# Patient Record
Sex: Female | Born: 1955
Health system: Southern US, Community
[De-identification: ages and names within clinical notes are randomized; demographics above are authoritative.]

## PROBLEM LIST (undated history)

## (undated) DIAGNOSIS — E039 Hypothyroidism, unspecified: Secondary | ICD-10-CM

## (undated) DIAGNOSIS — K219 Gastro-esophageal reflux disease without esophagitis: Secondary | ICD-10-CM

## (undated) DIAGNOSIS — C8590 Non-Hodgkin lymphoma, unspecified, unspecified site: Secondary | ICD-10-CM

## (undated) HISTORY — PX: PORTACATH PLACEMENT: SHX2246

## (undated) HISTORY — DX: Hypothyroidism, unspecified: E03.9

## (undated) HISTORY — PX: CATARACT EXTRACTION: SUR2

## (undated) HISTORY — PX: TONSILLECTOMY: SUR1361

## (undated) HISTORY — PX: CHOLECYSTECTOMY: SHX55

---

## 2004-07-25 ENCOUNTER — Ambulatory Visit: Payer: Self-pay | Admitting: Family Medicine

## 2005-06-02 ENCOUNTER — Ambulatory Visit: Payer: Self-pay | Admitting: Family Medicine

## 2005-07-20 ENCOUNTER — Ambulatory Visit: Payer: Self-pay | Admitting: Family Medicine

## 2005-09-28 ENCOUNTER — Ambulatory Visit: Payer: Self-pay | Admitting: Family Medicine

## 2005-11-20 ENCOUNTER — Ambulatory Visit: Payer: Self-pay | Admitting: Family Medicine

## 2006-03-26 ENCOUNTER — Ambulatory Visit: Payer: Self-pay | Admitting: Family Medicine

## 2016-02-23 DIAGNOSIS — H2513 Age-related nuclear cataract, bilateral: Secondary | ICD-10-CM | POA: Diagnosis not present

## 2016-10-03 DIAGNOSIS — H2513 Age-related nuclear cataract, bilateral: Secondary | ICD-10-CM | POA: Diagnosis not present

## 2016-11-24 DIAGNOSIS — H25013 Cortical age-related cataract, bilateral: Secondary | ICD-10-CM | POA: Diagnosis not present

## 2016-11-24 DIAGNOSIS — H5213 Myopia, bilateral: Secondary | ICD-10-CM | POA: Diagnosis not present

## 2016-11-24 DIAGNOSIS — H524 Presbyopia: Secondary | ICD-10-CM | POA: Diagnosis not present

## 2016-11-24 DIAGNOSIS — H2513 Age-related nuclear cataract, bilateral: Secondary | ICD-10-CM | POA: Diagnosis not present

## 2016-12-12 DIAGNOSIS — H25011 Cortical age-related cataract, right eye: Secondary | ICD-10-CM | POA: Diagnosis not present

## 2016-12-12 DIAGNOSIS — H2511 Age-related nuclear cataract, right eye: Secondary | ICD-10-CM | POA: Diagnosis not present

## 2016-12-12 DIAGNOSIS — H25811 Combined forms of age-related cataract, right eye: Secondary | ICD-10-CM | POA: Diagnosis not present

## 2017-01-16 DIAGNOSIS — H25812 Combined forms of age-related cataract, left eye: Secondary | ICD-10-CM | POA: Diagnosis not present

## 2017-01-16 DIAGNOSIS — H2512 Age-related nuclear cataract, left eye: Secondary | ICD-10-CM | POA: Diagnosis not present

## 2017-01-16 DIAGNOSIS — H25012 Cortical age-related cataract, left eye: Secondary | ICD-10-CM | POA: Diagnosis not present

## 2017-05-18 DIAGNOSIS — G35 Multiple sclerosis: Secondary | ICD-10-CM | POA: Diagnosis not present

## 2017-06-13 DIAGNOSIS — Z961 Presence of intraocular lens: Secondary | ICD-10-CM | POA: Diagnosis not present

## 2017-08-17 DIAGNOSIS — M9901 Segmental and somatic dysfunction of cervical region: Secondary | ICD-10-CM | POA: Diagnosis not present

## 2017-08-17 DIAGNOSIS — M5413 Radiculopathy, cervicothoracic region: Secondary | ICD-10-CM | POA: Diagnosis not present

## 2017-08-17 DIAGNOSIS — M546 Pain in thoracic spine: Secondary | ICD-10-CM | POA: Diagnosis not present

## 2017-08-17 DIAGNOSIS — M9902 Segmental and somatic dysfunction of thoracic region: Secondary | ICD-10-CM | POA: Diagnosis not present

## 2017-08-20 DIAGNOSIS — M9902 Segmental and somatic dysfunction of thoracic region: Secondary | ICD-10-CM | POA: Diagnosis not present

## 2017-08-20 DIAGNOSIS — M546 Pain in thoracic spine: Secondary | ICD-10-CM | POA: Diagnosis not present

## 2017-08-20 DIAGNOSIS — M5413 Radiculopathy, cervicothoracic region: Secondary | ICD-10-CM | POA: Diagnosis not present

## 2017-08-20 DIAGNOSIS — M9901 Segmental and somatic dysfunction of cervical region: Secondary | ICD-10-CM | POA: Diagnosis not present

## 2017-08-24 DIAGNOSIS — M9901 Segmental and somatic dysfunction of cervical region: Secondary | ICD-10-CM | POA: Diagnosis not present

## 2017-08-24 DIAGNOSIS — M546 Pain in thoracic spine: Secondary | ICD-10-CM | POA: Diagnosis not present

## 2017-08-24 DIAGNOSIS — M5413 Radiculopathy, cervicothoracic region: Secondary | ICD-10-CM | POA: Diagnosis not present

## 2017-08-24 DIAGNOSIS — M9902 Segmental and somatic dysfunction of thoracic region: Secondary | ICD-10-CM | POA: Diagnosis not present

## 2017-08-28 DIAGNOSIS — M9901 Segmental and somatic dysfunction of cervical region: Secondary | ICD-10-CM | POA: Diagnosis not present

## 2017-08-28 DIAGNOSIS — M9902 Segmental and somatic dysfunction of thoracic region: Secondary | ICD-10-CM | POA: Diagnosis not present

## 2017-08-28 DIAGNOSIS — M5413 Radiculopathy, cervicothoracic region: Secondary | ICD-10-CM | POA: Diagnosis not present

## 2017-08-28 DIAGNOSIS — M546 Pain in thoracic spine: Secondary | ICD-10-CM | POA: Diagnosis not present

## 2017-08-30 DIAGNOSIS — M546 Pain in thoracic spine: Secondary | ICD-10-CM | POA: Diagnosis not present

## 2017-08-30 DIAGNOSIS — M9901 Segmental and somatic dysfunction of cervical region: Secondary | ICD-10-CM | POA: Diagnosis not present

## 2017-08-30 DIAGNOSIS — M5413 Radiculopathy, cervicothoracic region: Secondary | ICD-10-CM | POA: Diagnosis not present

## 2017-08-30 DIAGNOSIS — M9902 Segmental and somatic dysfunction of thoracic region: Secondary | ICD-10-CM | POA: Diagnosis not present

## 2017-09-03 DIAGNOSIS — M9902 Segmental and somatic dysfunction of thoracic region: Secondary | ICD-10-CM | POA: Diagnosis not present

## 2017-09-03 DIAGNOSIS — M9901 Segmental and somatic dysfunction of cervical region: Secondary | ICD-10-CM | POA: Diagnosis not present

## 2017-09-03 DIAGNOSIS — M546 Pain in thoracic spine: Secondary | ICD-10-CM | POA: Diagnosis not present

## 2017-09-03 DIAGNOSIS — M5413 Radiculopathy, cervicothoracic region: Secondary | ICD-10-CM | POA: Diagnosis not present

## 2017-09-05 ENCOUNTER — Other Ambulatory Visit (HOSPITAL_COMMUNITY): Payer: Self-pay | Admitting: Chiropractic Medicine

## 2017-09-05 DIAGNOSIS — M5413 Radiculopathy, cervicothoracic region: Secondary | ICD-10-CM

## 2017-09-06 DIAGNOSIS — M546 Pain in thoracic spine: Secondary | ICD-10-CM | POA: Diagnosis not present

## 2017-09-06 DIAGNOSIS — M9901 Segmental and somatic dysfunction of cervical region: Secondary | ICD-10-CM | POA: Diagnosis not present

## 2017-09-06 DIAGNOSIS — M9902 Segmental and somatic dysfunction of thoracic region: Secondary | ICD-10-CM | POA: Diagnosis not present

## 2017-09-06 DIAGNOSIS — M5413 Radiculopathy, cervicothoracic region: Secondary | ICD-10-CM | POA: Diagnosis not present

## 2017-09-10 ENCOUNTER — Ambulatory Visit (HOSPITAL_COMMUNITY): Payer: Self-pay

## 2017-09-12 ENCOUNTER — Ambulatory Visit (HOSPITAL_COMMUNITY)
Admission: RE | Admit: 2017-09-12 | Discharge: 2017-09-12 | Disposition: A | Discharge status: Home / Self Care | Payer: Self-pay | Source: Ambulatory Visit | Attending: Chiropractic Medicine | Admitting: Chiropractic Medicine

## 2017-09-12 DIAGNOSIS — M488X2 Other specified spondylopathies, cervical region: Secondary | ICD-10-CM | POA: Insufficient documentation

## 2017-09-12 DIAGNOSIS — M5413 Radiculopathy, cervicothoracic region: Secondary | ICD-10-CM | POA: Insufficient documentation

## 2017-09-12 DIAGNOSIS — M47812 Spondylosis without myelopathy or radiculopathy, cervical region: Secondary | ICD-10-CM | POA: Insufficient documentation

## 2017-09-14 DIAGNOSIS — R221 Localized swelling, mass and lump, neck: Secondary | ICD-10-CM | POA: Diagnosis not present

## 2017-09-20 DIAGNOSIS — G540 Brachial plexus disorders: Secondary | ICD-10-CM | POA: Diagnosis not present

## 2017-09-20 DIAGNOSIS — E049 Nontoxic goiter, unspecified: Secondary | ICD-10-CM | POA: Diagnosis not present

## 2017-09-20 DIAGNOSIS — M79601 Pain in right arm: Secondary | ICD-10-CM | POA: Diagnosis not present

## 2017-09-20 DIAGNOSIS — E079 Disorder of thyroid, unspecified: Secondary | ICD-10-CM | POA: Diagnosis not present

## 2017-09-20 DIAGNOSIS — Z79891 Long term (current) use of opiate analgesic: Secondary | ICD-10-CM | POA: Diagnosis not present

## 2017-09-20 DIAGNOSIS — R221 Localized swelling, mass and lump, neck: Secondary | ICD-10-CM | POA: Diagnosis not present

## 2017-09-27 DIAGNOSIS — C8331 Diffuse large B-cell lymphoma, lymph nodes of head, face, and neck: Secondary | ICD-10-CM | POA: Diagnosis not present

## 2017-09-27 DIAGNOSIS — Z880 Allergy status to penicillin: Secondary | ICD-10-CM | POA: Diagnosis not present

## 2017-09-27 DIAGNOSIS — E039 Hypothyroidism, unspecified: Secondary | ICD-10-CM | POA: Diagnosis not present

## 2017-09-27 DIAGNOSIS — R221 Localized swelling, mass and lump, neck: Secondary | ICD-10-CM | POA: Diagnosis not present

## 2017-09-27 DIAGNOSIS — Z79890 Hormone replacement therapy (postmenopausal): Secondary | ICD-10-CM | POA: Diagnosis not present

## 2017-09-27 DIAGNOSIS — Z79891 Long term (current) use of opiate analgesic: Secondary | ICD-10-CM | POA: Diagnosis not present

## 2017-09-27 DIAGNOSIS — Z79899 Other long term (current) drug therapy: Secondary | ICD-10-CM | POA: Diagnosis not present

## 2017-09-27 DIAGNOSIS — Z7952 Long term (current) use of systemic steroids: Secondary | ICD-10-CM | POA: Diagnosis not present

## 2017-10-01 DIAGNOSIS — Z880 Allergy status to penicillin: Secondary | ICD-10-CM | POA: Diagnosis not present

## 2017-10-01 DIAGNOSIS — E039 Hypothyroidism, unspecified: Secondary | ICD-10-CM | POA: Diagnosis not present

## 2017-10-01 DIAGNOSIS — Z79891 Long term (current) use of opiate analgesic: Secondary | ICD-10-CM | POA: Diagnosis not present

## 2017-10-01 DIAGNOSIS — Z79890 Hormone replacement therapy (postmenopausal): Secondary | ICD-10-CM | POA: Diagnosis not present

## 2017-10-01 DIAGNOSIS — Z79899 Other long term (current) drug therapy: Secondary | ICD-10-CM | POA: Diagnosis not present

## 2017-10-01 DIAGNOSIS — C8331 Diffuse large B-cell lymphoma, lymph nodes of head, face, and neck: Secondary | ICD-10-CM | POA: Diagnosis not present

## 2017-10-01 DIAGNOSIS — C8339 Diffuse large B-cell lymphoma, extranodal and solid organ sites: Secondary | ICD-10-CM | POA: Diagnosis not present

## 2017-10-01 DIAGNOSIS — R221 Localized swelling, mass and lump, neck: Secondary | ICD-10-CM | POA: Diagnosis not present

## 2017-10-01 DIAGNOSIS — Z7952 Long term (current) use of systemic steroids: Secondary | ICD-10-CM | POA: Diagnosis not present

## 2017-10-02 DIAGNOSIS — G540 Brachial plexus disorders: Secondary | ICD-10-CM | POA: Diagnosis not present

## 2017-10-02 DIAGNOSIS — K6389 Other specified diseases of intestine: Secondary | ICD-10-CM | POA: Diagnosis not present

## 2017-10-02 DIAGNOSIS — K639 Disease of intestine, unspecified: Secondary | ICD-10-CM | POA: Diagnosis not present

## 2017-10-02 DIAGNOSIS — K449 Diaphragmatic hernia without obstruction or gangrene: Secondary | ICD-10-CM | POA: Diagnosis not present

## 2017-10-11 DIAGNOSIS — C8330 Diffuse large B-cell lymphoma, unspecified site: Secondary | ICD-10-CM | POA: Diagnosis not present

## 2017-10-11 DIAGNOSIS — Z452 Encounter for adjustment and management of vascular access device: Secondary | ICD-10-CM | POA: Diagnosis not present

## 2017-10-11 DIAGNOSIS — Z8739 Personal history of other diseases of the musculoskeletal system and connective tissue: Secondary | ICD-10-CM | POA: Diagnosis not present

## 2017-10-11 DIAGNOSIS — K639 Disease of intestine, unspecified: Secondary | ICD-10-CM | POA: Diagnosis not present

## 2017-10-11 DIAGNOSIS — G540 Brachial plexus disorders: Secondary | ICD-10-CM | POA: Diagnosis not present

## 2017-10-12 DIAGNOSIS — D696 Thrombocytopenia, unspecified: Secondary | ICD-10-CM | POA: Diagnosis not present

## 2017-10-12 DIAGNOSIS — Z006 Encounter for examination for normal comparison and control in clinical research program: Secondary | ICD-10-CM | POA: Diagnosis not present

## 2017-10-12 DIAGNOSIS — Z5111 Encounter for antineoplastic chemotherapy: Secondary | ICD-10-CM | POA: Diagnosis not present

## 2017-10-12 DIAGNOSIS — C8331 Diffuse large B-cell lymphoma, lymph nodes of head, face, and neck: Secondary | ICD-10-CM | POA: Diagnosis not present

## 2017-10-12 DIAGNOSIS — Z5112 Encounter for antineoplastic immunotherapy: Secondary | ICD-10-CM | POA: Diagnosis not present

## 2017-10-17 DIAGNOSIS — R933 Abnormal findings on diagnostic imaging of other parts of digestive tract: Secondary | ICD-10-CM | POA: Diagnosis not present

## 2017-10-17 DIAGNOSIS — Z79899 Other long term (current) drug therapy: Secondary | ICD-10-CM | POA: Diagnosis not present

## 2017-10-17 DIAGNOSIS — E039 Hypothyroidism, unspecified: Secondary | ICD-10-CM | POA: Diagnosis not present

## 2017-10-17 DIAGNOSIS — Z880 Allergy status to penicillin: Secondary | ICD-10-CM | POA: Diagnosis not present

## 2017-10-17 DIAGNOSIS — D127 Benign neoplasm of rectosigmoid junction: Secondary | ICD-10-CM | POA: Diagnosis not present

## 2017-10-17 DIAGNOSIS — K635 Polyp of colon: Secondary | ICD-10-CM | POA: Diagnosis not present

## 2017-10-17 DIAGNOSIS — K621 Rectal polyp: Secondary | ICD-10-CM | POA: Diagnosis not present

## 2017-10-17 DIAGNOSIS — Z7952 Long term (current) use of systemic steroids: Secondary | ICD-10-CM | POA: Diagnosis not present

## 2017-10-17 DIAGNOSIS — G35 Multiple sclerosis: Secondary | ICD-10-CM | POA: Diagnosis not present

## 2017-10-17 DIAGNOSIS — Z1211 Encounter for screening for malignant neoplasm of colon: Secondary | ICD-10-CM | POA: Diagnosis not present

## 2017-10-17 DIAGNOSIS — K5730 Diverticulosis of large intestine without perforation or abscess without bleeding: Secondary | ICD-10-CM | POA: Diagnosis not present

## 2017-10-17 DIAGNOSIS — Z79891 Long term (current) use of opiate analgesic: Secondary | ICD-10-CM | POA: Diagnosis not present

## 2017-10-17 DIAGNOSIS — C8330 Diffuse large B-cell lymphoma, unspecified site: Secondary | ICD-10-CM | POA: Diagnosis not present

## 2017-10-17 DIAGNOSIS — Z87891 Personal history of nicotine dependence: Secondary | ICD-10-CM | POA: Diagnosis not present

## 2017-10-18 DIAGNOSIS — E039 Hypothyroidism, unspecified: Secondary | ICD-10-CM | POA: Diagnosis not present

## 2017-10-18 DIAGNOSIS — K219 Gastro-esophageal reflux disease without esophagitis: Secondary | ICD-10-CM | POA: Diagnosis not present

## 2017-10-18 DIAGNOSIS — C8580 Other specified types of non-Hodgkin lymphoma, unspecified site: Secondary | ICD-10-CM | POA: Diagnosis not present

## 2017-10-19 DIAGNOSIS — Z79899 Other long term (current) drug therapy: Secondary | ICD-10-CM | POA: Diagnosis not present

## 2017-10-19 DIAGNOSIS — G540 Brachial plexus disorders: Secondary | ICD-10-CM | POA: Diagnosis not present

## 2017-10-19 DIAGNOSIS — Z452 Encounter for adjustment and management of vascular access device: Secondary | ICD-10-CM | POA: Diagnosis not present

## 2017-10-19 DIAGNOSIS — C8330 Diffuse large B-cell lymphoma, unspecified site: Secondary | ICD-10-CM | POA: Diagnosis not present

## 2017-11-01 DIAGNOSIS — C8331 Diffuse large B-cell lymphoma, lymph nodes of head, face, and neck: Secondary | ICD-10-CM | POA: Diagnosis not present

## 2017-11-01 DIAGNOSIS — G35 Multiple sclerosis: Secondary | ICD-10-CM | POA: Diagnosis not present

## 2017-11-01 DIAGNOSIS — Z79899 Other long term (current) drug therapy: Secondary | ICD-10-CM | POA: Diagnosis not present

## 2017-11-01 DIAGNOSIS — K6389 Other specified diseases of intestine: Secondary | ICD-10-CM | POA: Diagnosis not present

## 2017-11-01 DIAGNOSIS — Z95828 Presence of other vascular implants and grafts: Secondary | ICD-10-CM | POA: Diagnosis not present

## 2017-11-22 DIAGNOSIS — Z79899 Other long term (current) drug therapy: Secondary | ICD-10-CM | POA: Diagnosis not present

## 2017-11-22 DIAGNOSIS — C8331 Diffuse large B-cell lymphoma, lymph nodes of head, face, and neck: Secondary | ICD-10-CM | POA: Diagnosis not present

## 2017-11-22 DIAGNOSIS — Z880 Allergy status to penicillin: Secondary | ICD-10-CM | POA: Diagnosis not present

## 2017-11-22 DIAGNOSIS — K639 Disease of intestine, unspecified: Secondary | ICD-10-CM | POA: Diagnosis not present

## 2017-11-22 DIAGNOSIS — C8330 Diffuse large B-cell lymphoma, unspecified site: Secondary | ICD-10-CM | POA: Diagnosis not present

## 2017-11-22 DIAGNOSIS — G35 Multiple sclerosis: Secondary | ICD-10-CM | POA: Diagnosis not present

## 2017-12-06 DIAGNOSIS — K449 Diaphragmatic hernia without obstruction or gangrene: Secondary | ICD-10-CM | POA: Diagnosis not present

## 2017-12-06 DIAGNOSIS — C8330 Diffuse large B-cell lymphoma, unspecified site: Secondary | ICD-10-CM | POA: Diagnosis not present

## 2017-12-06 DIAGNOSIS — Z452 Encounter for adjustment and management of vascular access device: Secondary | ICD-10-CM | POA: Diagnosis not present

## 2017-12-11 DIAGNOSIS — C8330 Diffuse large B-cell lymphoma, unspecified site: Secondary | ICD-10-CM | POA: Diagnosis not present

## 2017-12-11 DIAGNOSIS — E039 Hypothyroidism, unspecified: Secondary | ICD-10-CM | POA: Diagnosis not present

## 2017-12-11 DIAGNOSIS — Z452 Encounter for adjustment and management of vascular access device: Secondary | ICD-10-CM | POA: Diagnosis not present

## 2017-12-13 DIAGNOSIS — K6389 Other specified diseases of intestine: Secondary | ICD-10-CM | POA: Diagnosis not present

## 2017-12-13 DIAGNOSIS — C8330 Diffuse large B-cell lymphoma, unspecified site: Secondary | ICD-10-CM | POA: Diagnosis not present

## 2017-12-13 DIAGNOSIS — Z880 Allergy status to penicillin: Secondary | ICD-10-CM | POA: Diagnosis not present

## 2017-12-25 ENCOUNTER — Emergency Department (HOSPITAL_COMMUNITY): Payer: BLUE CROSS/BLUE SHIELD

## 2017-12-25 ENCOUNTER — Emergency Department (HOSPITAL_COMMUNITY)
Admission: EM | Admit: 2017-12-25 | Discharge: 2017-12-25 | Disposition: A | Discharge status: Home / Self Care | Payer: BLUE CROSS/BLUE SHIELD | Attending: Emergency Medicine | Admitting: Emergency Medicine

## 2017-12-25 ENCOUNTER — Other Ambulatory Visit: Payer: Self-pay

## 2017-12-25 ENCOUNTER — Encounter (HOSPITAL_COMMUNITY): Payer: Self-pay | Admitting: *Deleted

## 2017-12-25 DIAGNOSIS — Y999 Unspecified external cause status: Secondary | ICD-10-CM | POA: Diagnosis not present

## 2017-12-25 DIAGNOSIS — E039 Hypothyroidism, unspecified: Secondary | ICD-10-CM | POA: Insufficient documentation

## 2017-12-25 DIAGNOSIS — S52612A Displaced fracture of left ulna styloid process, initial encounter for closed fracture: Secondary | ICD-10-CM | POA: Diagnosis not present

## 2017-12-25 DIAGNOSIS — Z87891 Personal history of nicotine dependence: Secondary | ICD-10-CM | POA: Insufficient documentation

## 2017-12-25 DIAGNOSIS — Y92009 Unspecified place in unspecified non-institutional (private) residence as the place of occurrence of the external cause: Secondary | ICD-10-CM | POA: Diagnosis not present

## 2017-12-25 DIAGNOSIS — Y93K1 Activity, walking an animal: Secondary | ICD-10-CM | POA: Insufficient documentation

## 2017-12-25 DIAGNOSIS — S52572A Other intraarticular fracture of lower end of left radius, initial encounter for closed fracture: Secondary | ICD-10-CM

## 2017-12-25 DIAGNOSIS — S52615A Nondisplaced fracture of left ulna styloid process, initial encounter for closed fracture: Secondary | ICD-10-CM

## 2017-12-25 DIAGNOSIS — S59292A Other physeal fracture of lower end of radius, left arm, initial encounter for closed fracture: Secondary | ICD-10-CM | POA: Diagnosis not present

## 2017-12-25 DIAGNOSIS — W010XXA Fall on same level from slipping, tripping and stumbling without subsequent striking against object, initial encounter: Secondary | ICD-10-CM | POA: Diagnosis not present

## 2017-12-25 DIAGNOSIS — S6992XA Unspecified injury of left wrist, hand and finger(s), initial encounter: Secondary | ICD-10-CM | POA: Diagnosis not present

## 2017-12-25 HISTORY — DX: Non-Hodgkin lymphoma, unspecified, unspecified site: C85.90

## 2017-12-25 HISTORY — DX: Hypothyroidism, unspecified: E03.9

## 2017-12-25 NOTE — Discharge Instructions (Addendum)
Use ice and elevation as much as possible to help with pain and wrist swelling.  Get rechecked immediately for any fingertip swelling , weakness, numbness or change in coloration.

## 2017-12-25 NOTE — ED Provider Notes (Signed)
Patient tripped and fell over the dog this morning landing on her left wrist.  Complains of left left wrist pain.  No other injury.  She treated self with oxycodone with partial relief.  On exam she is alert Glasgow Coma Score 15 right upper extremity swelling at wrist with corresponding tenderness radial pulse 2+.  Digits with good capillary refill.  Sensation grossly intact X-rays viewed by me   Orlie Dakin, MD 12/25/17 1510

## 2017-12-25 NOTE — ED Triage Notes (Addendum)
Pt c/o left wrist and forearm pain after she fell today. Pt reports she was letting the dog out and she got twisted up in the leash and fell. Denies LOC. Pt reports she took 1 Oxycodone at home at 1100 prior to coming.

## 2017-12-26 DIAGNOSIS — S52572A Other intraarticular fracture of lower end of left radius, initial encounter for closed fracture: Secondary | ICD-10-CM | POA: Diagnosis not present

## 2017-12-27 ENCOUNTER — Encounter (HOSPITAL_BASED_OUTPATIENT_CLINIC_OR_DEPARTMENT_OTHER): Payer: Self-pay | Admitting: *Deleted

## 2017-12-27 ENCOUNTER — Other Ambulatory Visit: Payer: Self-pay | Admitting: Orthopedic Surgery

## 2017-12-28 ENCOUNTER — Other Ambulatory Visit: Payer: Self-pay

## 2017-12-28 ENCOUNTER — Ambulatory Visit (HOSPITAL_BASED_OUTPATIENT_CLINIC_OR_DEPARTMENT_OTHER)
Admission: RE | Admit: 2017-12-28 | Discharge: 2017-12-28 | Disposition: A | Discharge status: Home / Self Care | Payer: BLUE CROSS/BLUE SHIELD | Source: Ambulatory Visit | Attending: Orthopedic Surgery | Admitting: Orthopedic Surgery

## 2017-12-28 ENCOUNTER — Encounter (HOSPITAL_BASED_OUTPATIENT_CLINIC_OR_DEPARTMENT_OTHER)
Admission: RE | Disposition: A | Discharge status: Home / Self Care | Payer: Self-pay | Source: Ambulatory Visit | Attending: Orthopedic Surgery

## 2017-12-28 ENCOUNTER — Encounter (HOSPITAL_BASED_OUTPATIENT_CLINIC_OR_DEPARTMENT_OTHER): Payer: Self-pay | Admitting: Emergency Medicine

## 2017-12-28 ENCOUNTER — Ambulatory Visit (HOSPITAL_BASED_OUTPATIENT_CLINIC_OR_DEPARTMENT_OTHER): Payer: BLUE CROSS/BLUE SHIELD | Admitting: Anesthesiology

## 2017-12-28 DIAGNOSIS — Z87891 Personal history of nicotine dependence: Secondary | ICD-10-CM | POA: Insufficient documentation

## 2017-12-28 DIAGNOSIS — K219 Gastro-esophageal reflux disease without esophagitis: Secondary | ICD-10-CM | POA: Insufficient documentation

## 2017-12-28 DIAGNOSIS — E039 Hypothyroidism, unspecified: Secondary | ICD-10-CM | POA: Diagnosis not present

## 2017-12-28 DIAGNOSIS — C8590 Non-Hodgkin lymphoma, unspecified, unspecified site: Secondary | ICD-10-CM | POA: Diagnosis not present

## 2017-12-28 DIAGNOSIS — W19XXXA Unspecified fall, initial encounter: Secondary | ICD-10-CM | POA: Insufficient documentation

## 2017-12-28 DIAGNOSIS — S52572A Other intraarticular fracture of lower end of left radius, initial encounter for closed fracture: Secondary | ICD-10-CM | POA: Diagnosis not present

## 2017-12-28 DIAGNOSIS — Z79899 Other long term (current) drug therapy: Secondary | ICD-10-CM | POA: Insufficient documentation

## 2017-12-28 DIAGNOSIS — G8918 Other acute postprocedural pain: Secondary | ICD-10-CM | POA: Diagnosis not present

## 2017-12-28 DIAGNOSIS — S52502A Unspecified fracture of the lower end of left radius, initial encounter for closed fracture: Secondary | ICD-10-CM | POA: Diagnosis not present

## 2017-12-28 DIAGNOSIS — Z79890 Hormone replacement therapy (postmenopausal): Secondary | ICD-10-CM | POA: Diagnosis not present

## 2017-12-28 HISTORY — PX: OPEN REDUCTION INTERNAL FIXATION (ORIF) DISTAL RADIAL FRACTURE: SHX5989

## 2017-12-28 HISTORY — DX: Gastro-esophageal reflux disease without esophagitis: K21.9

## 2017-12-28 SURGERY — OPEN REDUCTION INTERNAL FIXATION (ORIF) DISTAL RADIUS FRACTURE
Anesthesia: Regional | Site: Wrist | Laterality: Left

## 2017-12-28 MED ORDER — PHENYLEPHRINE 40 MCG/ML (10ML) SYRINGE FOR IV PUSH (FOR BLOOD PRESSURE SUPPORT)
PREFILLED_SYRINGE | INTRAVENOUS | Status: AC
  Filled 2017-12-28: qty 10

## 2017-12-28 MED ORDER — MIDAZOLAM HCL 2 MG/2ML IJ SOLN
1.0000 mg | INTRAMUSCULAR | Status: DC | PRN
  Administered 2017-12-28: 1 mg via INTRAVENOUS

## 2017-12-28 MED ORDER — PHENYLEPHRINE HCL 10 MG/ML IJ SOLN
INTRAMUSCULAR | Status: DC | PRN
  Administered 2017-12-28: 40 ug via INTRAVENOUS

## 2017-12-28 MED ORDER — FENTANYL CITRATE (PF) 100 MCG/2ML IJ SOLN
INTRAMUSCULAR | Status: AC
Start: 2017-12-28 — End: 2017-12-28
  Filled 2017-12-28: qty 2

## 2017-12-28 MED ORDER — SCOPOLAMINE 1 MG/3DAYS TD PT72: 1.0000 | MEDICATED_PATCH | Freq: Once | TRANSDERMAL | Status: DC | PRN

## 2017-12-28 MED ORDER — LIDOCAINE HCL (CARDIAC) 20 MG/ML IV SOLN
INTRAVENOUS | Status: AC
  Filled 2017-12-28: qty 5

## 2017-12-28 MED ORDER — HYDROCODONE-ACETAMINOPHEN 7.5-325 MG PO TABS: 1.0000 | ORAL_TABLET | Freq: Once | ORAL | Status: DC | PRN

## 2017-12-28 MED ORDER — FENTANYL CITRATE (PF) 100 MCG/2ML IJ SOLN
50.0000 ug | INTRAMUSCULAR | Status: DC | PRN
  Administered 2017-12-28: 50 ug via INTRAVENOUS

## 2017-12-28 MED ORDER — ACETAMINOPHEN 10 MG/ML IV SOLN: 1000.0000 mg | Freq: Once | INTRAVENOUS | Status: DC | PRN

## 2017-12-28 MED ORDER — PROMETHAZINE HCL 25 MG/ML IJ SOLN
6.2500 mg | INTRAMUSCULAR | Status: DC | PRN
Start: 2017-12-28 — End: 2017-12-28

## 2017-12-28 MED ORDER — DEXAMETHASONE SODIUM PHOSPHATE 4 MG/ML IJ SOLN
INTRAMUSCULAR | Status: DC | PRN
  Administered 2017-12-28: 10 mg via INTRAVENOUS

## 2017-12-28 MED ORDER — ONDANSETRON HCL 4 MG/2ML IJ SOLN
INTRAMUSCULAR | Status: AC
  Filled 2017-12-28: qty 2

## 2017-12-28 MED ORDER — VANCOMYCIN HCL IN DEXTROSE 1-5 GM/200ML-% IV SOLN
1000.0000 mg | INTRAVENOUS | Status: AC
  Administered 2017-12-28: 1000 mg via INTRAVENOUS

## 2017-12-28 MED ORDER — LACTATED RINGERS IV SOLN
INTRAVENOUS | Status: DC
  Administered 2017-12-28 (×2): via INTRAVENOUS

## 2017-12-28 MED ORDER — MEPERIDINE HCL 25 MG/ML IJ SOLN: 6.2500 mg | INTRAMUSCULAR | Status: DC | PRN

## 2017-12-28 MED ORDER — VANCOMYCIN HCL IN DEXTROSE 1-5 GM/200ML-% IV SOLN
INTRAVENOUS | Status: AC
  Filled 2017-12-28: qty 200

## 2017-12-28 MED ORDER — HYDROMORPHONE HCL 1 MG/ML IJ SOLN: 0.2500 mg | INTRAMUSCULAR | Status: DC | PRN

## 2017-12-28 MED ORDER — ROPIVACAINE HCL 5 MG/ML IJ SOLN
INTRAMUSCULAR | Status: DC | PRN
  Administered 2017-12-28: 30 mL via PERINEURAL

## 2017-12-28 MED ORDER — PROPOFOL 10 MG/ML IV BOLUS
INTRAVENOUS | Status: DC | PRN
  Administered 2017-12-28: 150 mg via INTRAVENOUS

## 2017-12-28 MED ORDER — CHLORHEXIDINE GLUCONATE 4 % EX LIQD: 60.0000 mL | Freq: Once | CUTANEOUS | Status: DC

## 2017-12-28 MED ORDER — MIDAZOLAM HCL 2 MG/2ML IJ SOLN
INTRAMUSCULAR | Status: AC
  Filled 2017-12-28: qty 2

## 2017-12-28 MED ORDER — LIDOCAINE 2% (20 MG/ML) 5 ML SYRINGE
INTRAMUSCULAR | Status: DC | PRN
  Administered 2017-12-28: 80 mg via INTRAVENOUS

## 2017-12-28 MED ORDER — DEXAMETHASONE SODIUM PHOSPHATE 10 MG/ML IJ SOLN
INTRAMUSCULAR | Status: AC
Start: 2017-12-28 — End: 2017-12-28
  Filled 2017-12-28: qty 1

## 2017-12-28 SURGICAL SUPPLY — 58 items
BANDAGE ACE 3X5.8 VEL STRL LF (GAUZE/BANDAGES/DRESSINGS) ×2 IMPLANT
BIT DRILL 2.0 LNG QUCK RELEASE (BIT) IMPLANT
BIT DRILL 2.8X5 QR DISP (BIT) ×1 IMPLANT
BLADE SURG 15 STRL LF DISP TIS (BLADE) ×2 IMPLANT
BLADE SURG 15 STRL SS (BLADE) ×4
BNDG CMPR 9X4 STRL LF SNTH (GAUZE/BANDAGES/DRESSINGS) ×1
BNDG ESMARK 4X9 LF (GAUZE/BANDAGES/DRESSINGS) ×2 IMPLANT
BNDG GAUZE ELAST 4 BULKY (GAUZE/BANDAGES/DRESSINGS) ×2 IMPLANT
BNDG PLASTER X FAST 3X3 WHT LF (CAST SUPPLIES) ×20 IMPLANT
BNDG PLSTR 9X3 FST ST WHT (CAST SUPPLIES) ×10
CHLORAPREP W/TINT 26ML (MISCELLANEOUS) ×2 IMPLANT
CORD BIPOLAR FORCEPS 12FT (ELECTRODE) ×2 IMPLANT
COVER BACK TABLE 60X90IN (DRAPES) ×2 IMPLANT
COVER MAYO STAND STRL (DRAPES) ×2 IMPLANT
CUFF TOURNIQUET SINGLE 18IN (TOURNIQUET CUFF) ×1 IMPLANT
CUFF TOURNIQUET SINGLE 24IN (TOURNIQUET CUFF) IMPLANT
DRAPE EXTREMITY T 121X128X90 (DRAPE) ×2 IMPLANT
DRAPE OEC MINIVIEW 54X84 (DRAPES) ×2 IMPLANT
DRAPE SURG 17X23 STRL (DRAPES) ×2 IMPLANT
DRILL 2.0 LNG QUICK RELEASE (BIT) ×2
GAUZE SPONGE 4X4 12PLY STRL (GAUZE/BANDAGES/DRESSINGS) ×2 IMPLANT
GAUZE XEROFORM 1X8 LF (GAUZE/BANDAGES/DRESSINGS) ×2 IMPLANT
GLOVE BIO SURGEON STRL SZ7.5 (GLOVE) ×2 IMPLANT
GLOVE BIOGEL M STRL SZ7.5 (GLOVE) ×1 IMPLANT
GLOVE BIOGEL PI IND STRL 8 (GLOVE) ×1 IMPLANT
GLOVE BIOGEL PI IND STRL 8.5 (GLOVE) IMPLANT
GLOVE BIOGEL PI INDICATOR 8 (GLOVE) ×2
GLOVE BIOGEL PI INDICATOR 8.5 (GLOVE) ×1
GLOVE SURG ORTHO 8.0 STRL STRW (GLOVE) ×1 IMPLANT
GOWN STRL REUS W/ TWL LRG LVL3 (GOWN DISPOSABLE) ×1 IMPLANT
GOWN STRL REUS W/TWL LRG LVL3 (GOWN DISPOSABLE) ×2
GOWN STRL REUS W/TWL XL LVL3 (GOWN DISPOSABLE) ×3 IMPLANT
GUIDEWIRE ORTHO 0.054X6 (WIRE) ×3 IMPLANT
NDL HYPO 25X1 1.5 SAFETY (NEEDLE) IMPLANT
NEEDLE HYPO 25X1 1.5 SAFETY (NEEDLE) ×2 IMPLANT
NS IRRIG 1000ML POUR BTL (IV SOLUTION) ×2 IMPLANT
PACK BASIN DAY SURGERY FS (CUSTOM PROCEDURE TRAY) ×2 IMPLANT
PAD CAST 3X4 CTTN HI CHSV (CAST SUPPLIES) ×1 IMPLANT
PADDING CAST COTTON 3X4 STRL (CAST SUPPLIES) ×2
PLATE PROX NARROW LEFT (Plate) ×1 IMPLANT
SCREW ACTK 2 NL HEX 3.5.11 (Screw) ×1 IMPLANT
SCREW CORT FT 18X2.3XLCK HEX (Screw) IMPLANT
SCREW CORTICAL LOCKING 2.3X18M (Screw) ×8 IMPLANT
SCREW CORTICAL LOCKING 2.3X20M (Screw) ×4 IMPLANT
SCREW FX18X2.3XSMTH LCK NS CRT (Screw) IMPLANT
SCREW FX20X2.3XSMTH LCK NS CRT (Screw) IMPLANT
SCREW NON LOCK 3.5X10MM (Screw) ×1 IMPLANT
SCREW NONLOCK HEX 3.5X12 (Screw) ×1 IMPLANT
SLEEVE SCD COMPRESS KNEE MED (MISCELLANEOUS) ×1 IMPLANT
SLING ARM FOAM STRAP LRG (SOFTGOODS) ×1 IMPLANT
STOCKINETTE 4X48 STRL (DRAPES) ×2 IMPLANT
SUT ETHILON 4 0 PS 2 18 (SUTURE) ×2 IMPLANT
SUT VICRYL 4-0 PS2 18IN ABS (SUTURE) ×2 IMPLANT
SYR BULB 3OZ (MISCELLANEOUS) ×2 IMPLANT
SYR CONTROL 10ML LL (SYRINGE) ×1 IMPLANT
TOWEL OR 17X24 6PK STRL BLUE (TOWEL DISPOSABLE) ×4 IMPLANT
TOWEL OR NON WOVEN STRL DISP B (DISPOSABLE) ×2 IMPLANT
UNDERPAD 30X30 (UNDERPADS AND DIAPERS) ×2 IMPLANT

## 2017-12-28 NOTE — Discharge Instructions (Addendum)

## 2017-12-28 NOTE — Op Note (Signed)
I assisted Surgeon(s) and Role:    Leanora Cover, MD - Primary on the Procedure(s): OPEN REDUCTION INTERNAL FIXATION (ORIF) LEFT DISTAL RADIAL FRACTURE on 12/28/2017.  I provided assistance on this case as follows: fixation of the fracture, closure of the incisions and application of the dressings and splint.  Electronically signed by: Wynonia Sours, MD Date: 12/28/2017 Time: 4:45 PM

## 2017-12-28 NOTE — H&P (Signed)
  Victoria Garner is an 62 y.o. female.   Chief Complaint: left wrist fracture HPI: 62 yo female states she fell a few days ago injuring left wrist.  Seen at [facility] where XR revealed distal radius fracture.  Splinted and followed up in office.  She wishes to have operative fixation.  Allergies:  Allergies  Allergen Reactions  . Penicillins Rash    Past Medical History:  Diagnosis Date  . GERD (gastroesophageal reflux disease)   . Hypothyroid   . Non Hodgkin's lymphoma ([place])    current treatment    Past Surgical History:  Procedure Laterality Date  . CATARACT EXTRACTION    . CESAREAN SECTION    . CHOLECYSTECTOMY    . PORTACATH PLACEMENT    . TONSILLECTOMY      Family History: History reviewed. No pertinent family history.  Social History:   reports that she has quit smoking. She has never used smokeless tobacco. She reports that she does not drink alcohol or use drugs.  Medications: Medications Prior to Admission  Medication Sig Dispense Refill  . levothyroxine (SYNTHROID, LEVOTHROID) 100 MCG tablet Take 100 mcg by mouth daily before breakfast.    . omeprazole (PRILOSEC) 20 MG capsule Take 20 mg by mouth daily.      No results found for this or any previous visit (from the past 48 hour(s)).  No results found.   A comprehensive review of systems was negative.  Blood pressure 123/61, pulse 84, temperature 98.1 F (36.7 C), temperature source Oral, resp. rate 14, height 5\' 4"  (1.626 m), weight 89.4 kg (197 lb), SpO2 97 %.  General appearance: alert, cooperative and appears stated age Head: Normocephalic, without obvious abnormality, atraumatic Neck: supple, symmetrical, trachea midline Cardio: regular rate and rhythm Resp: clear to auscultation bilaterally Extremities: Intact sensation and capillary refill all digits.  +epl/fpl/io.  No wounds.  Pulses: 2+ and symmetric Skin: Skin color, texture, turgor normal. No rashes or lesions Neurologic: Grossly  normal Incision/Wound: none  Assessment/Plan Left distal radius fracture.  Non operative and operative treatment options were discussed with the patient and patient wishes to proceed with operative treatment. Risks, benefits, and alternatives of surgery were discussed and the patient agrees with the plan of care.   Shavawn Stobaugh R 12/28/2017, 3:37 PM \

## 2017-12-28 NOTE — Anesthesia Procedure Notes (Addendum)
Anesthesia Regional Block: Supraclavicular block   Pre-Anesthetic Checklist: ,, timeout performed, Correct Patient, Correct Site, Correct Laterality, Correct Procedure, Correct Position, site marked, Risks and benefits discussed,  Surgical consent,  Pre-op evaluation,  At surgeon's request and post-op pain management  Laterality: Left  Prep: chloraprep       Needles:  Injection technique: Single-shot  Needle Type: Echogenic Stimulator Needle     Needle Length: 5cm  Needle Gauge: 21     Additional Needles:   Procedures:,,,, ultrasound used (permanent image in chart),,,,  Narrative:  Start time: 12/28/2017 2:39 PM End time: 12/28/2017 2:46 PM Injection made incrementally with aspirations every 5 mL.  Performed by: Personally  Anesthesiologist: Barnet Glasgow, MD

## 2017-12-28 NOTE — Transfer of Care (Signed)
Immediate Anesthesia Transfer of Care Note  Patient: Charlann Boxer  Procedure(s) Performed: OPEN REDUCTION INTERNAL FIXATION (ORIF) LEFT DISTAL RADIAL FRACTURE (Left Wrist)  Patient Location: PACU  Anesthesia Type:General  Level of Consciousness: awake and sedated  Airway & Oxygen Therapy: Patient Spontanous Breathing and Patient connected to face mask oxygen  Post-op Assessment: Report given to RN and Post -op Vital signs reviewed and stable  Post vital signs: Reviewed and stable  Last Vitals:  Vitals Value Taken Time  BP    Temp    Pulse 93 12/28/2017  4:49 PM  Resp 21 12/28/2017  4:49 PM  SpO2 100 % 12/28/2017  4:49 PM  Vitals shown include unvalidated device data.  Last Pain:  Vitals:   12/28/17 1354  TempSrc: Oral  PainSc: 3          Complications: No apparent anesthesia complications

## 2017-12-28 NOTE — Anesthesia Procedure Notes (Signed)
Procedure Name: LMA Insertion Performed by: Gittel Mccamish W, CRNA Pre-anesthesia Checklist: Patient identified, Emergency Drugs available, Suction available and Patient being monitored Patient Re-evaluated:Patient Re-evaluated prior to induction Oxygen Delivery Method: Circle system utilized Preoxygenation: Pre-oxygenation with 100% oxygen Induction Type: IV induction Ventilation: Mask ventilation without difficulty LMA: LMA inserted LMA Size: 4.0 Number of attempts: 1 Placement Confirmation: positive ETCO2 Tube secured with: Tape Dental Injury: Teeth and Oropharynx as per pre-operative assessment        

## 2017-12-28 NOTE — Anesthesia Postprocedure Evaluation (Signed)
Anesthesia Post Note  Patient: Victoria Garner  Procedure(s) Performed: OPEN REDUCTION INTERNAL FIXATION (ORIF) LEFT DISTAL RADIAL FRACTURE (Left Wrist)     Patient location during evaluation: PACU Anesthesia Type: Regional and General Level of consciousness: awake and alert Pain management: pain level controlled Vital Signs Assessment: post-procedure vital signs reviewed and stable Respiratory status: spontaneous breathing, nonlabored ventilation, respiratory function stable and patient connected to nasal cannula oxygen Cardiovascular status: blood pressure returned to baseline and stable Postop Assessment: no apparent nausea or vomiting Anesthetic complications: no    Last Vitals:  Vitals:   12/28/17 1530 12/28/17 1649  BP: 123/61 115/67  Pulse: 84 93  Resp: 14 (!) 21  Temp:  36.4 C  SpO2: 97% 100%    Last Pain:  Vitals:   12/28/17 1649  TempSrc:   PainSc: [place] A Krishawna Stiefel

## 2017-12-28 NOTE — Op Note (Signed)
12/28/2017 [facility]  Operative Note  Pre Op Diagnosis: Left comminuted intraarticular distal radius fracture  Post Op Diagnosis: Left comminuted intraarticular distal radius fracture  Procedure: ORIF Left comminuted intraarticular distal radius fracture, 2 intraarticular fragments  Surgeon: Victoria Cover, MD  Assistant: Daryll Brod, MD  Anesthesia: General and Regional  Fluids: Per anesthesia flow sheet  EBL: minimal  Complications: None  Specimen: None  Tourniquet Time:  Total Tourniquet Time Documented: Upper Arm (Left) - 42 minutes Total: Upper Arm (Left) - 42 minutes   Disposition: Stable to PACU  INDICATIONS:  Victoria Garner is a 62 y.o. female states she fell earlier this week injuring left wrist.  Seen at [facility] where XR revealed distal radius fracture.  Splinted and followed up in office.  We discussed nonoperative and operative treatment options.  She wished to proceed with operative fixation.  Risks, benefits, and alternatives of surgery were discussed including the risk of blood loss; infection; damage to nerves, vessels, tendons, ligaments, bone; failure of surgery; need for additional surgery; complications with wound healing; continued pain; nonunion; malunion; stiffness.  We also discussed the possible need for bone graft and the benefits and risks including the possibility of disease transmission.  She voiced understanding of these risks and elected to proceed.   OPERATIVE COURSE:  After being identified preoperatively by myself, the patient and I agreed upon the procedure and site of procedure.  Surgical site was marked.  The risks, benefits and alternatives of the surgery were reviewed and she wished to proceed.  Surgical consent had been signed.  She was given IV vancomycin as preoperative antibiotic prophylaxis.  She was transferred to the operating room and placed on the operating room table in supine position with the Left upper extremity on  an armboard. General and Regional anesthesia was induced by the anesthesiologist.  The Left upper extremity was prepped and draped in normal sterile orthopedic fashion.  A surgical pause was performed between the surgeons, anesthesia and operating room staff, and all were in agreement as to the patient, procedure and site of procedure.  Tourniquet at the proximal aspect of the extremity was inflated to 250 mmHg after exsanguination of the limb with an Esmarch bandage.  Standard volar Mallie Mussel approach was used.  The bipolar electrocautery was used to obtain hemostasis.  The superficial and deep portions of the FCR tendon sheath were incised, and the FCR and FPL were swept ulnarly to protect the palmar cutaneous branch of the median nerve.  The brachioradialis was released at the radial side of the radius.  The pronator quadratus was released and elevated with the periosteal elevator.  The fracture site was identified and cleared of soft tissue interposition and hematoma.  It was reduced under direct visualization.   An AcuMed volar distal radial locking plate was selected.  It was secured to the bone with the guidepins.  C-arm was used in AP and lateral projections to ensure appropriate reduction and position of the hardware and adjustments made as necessary.  Standard AO drilling and measuring technique was used.  A single screw was placed in the slotted hole in the shaft of the plate.  The distal holes were filled with locking pegs with the exception of the styloid holes, which were filled with locking screws.  The remaining holes in the shaft of the plate were filled with nonlocking screws.  Good purchase was obtained.  C-arm was used in AP, lateral and oblique projections to ensure appropriate reduction and position  of hardware, which was the case.  There was no intra-articular penetration of hardware.  The wound was copiously irrigated with sterile saline.  Pronator quadratus was repaired back over top of the  plate using 4-0 Vicryl suture.  Vicryl suture was placed in the subcutaneous tissues in an inverted interrupted fashion and the skin was closed with 4-0 nylon in a horizontal mattress fashion.  There was good pronation and supination of the wrist without crepitance.  The wound was then dressed with sterile Xeroform, 4x4s, and wrapped with a Kerlix bandage.  A volar splint was placed and wrapped with Kerlix and Ace bandage.  Tourniquet was deflated at 42 minutes.  Fingertips were pink with brisk capillary refill after deflation of the tourniquet.  Operative drapes were broken down.  The patient was awoken from anesthesia safely.  She was transferred back to the stretcher and taken to the PACU in stable condition.  I will see her back in the office in one week for postoperative followup.  She states she has pain medication at home already.   Tennis Must, MD Electronically signed, 12/28/17

## 2017-12-28 NOTE — Brief Op Note (Signed)
12/28/2017  4:45 PM  PATIENT:  Charlann Boxer  62 y.o. female  PRE-OPERATIVE DIAGNOSIS:  LEFT DISTAL RADIUS FRACTURE  POST-OPERATIVE DIAGNOSIS:  LEFT DISTAL RADIUS FRACTURE  PROCEDURE:  Procedure(s): OPEN REDUCTION INTERNAL FIXATION (ORIF) LEFT DISTAL RADIAL FRACTURE (Left)  SURGEON:  Surgeon(s) and Role:    Leanora Cover, MD - Primary  PHYSICIAN ASSISTANT:   ASSISTANTS: Daryll Brod, MD   ANESTHESIA:   regional and general  EBL:  5 mL   BLOOD ADMINISTERED:none  DRAINS: none   LOCAL MEDICATIONS USED:  NONE  SPECIMEN:  No Specimen  DISPOSITION OF SPECIMEN:  N/A  COUNTS:  YES  TOURNIQUET:   Total Tourniquet Time Documented: Upper Arm (Left) - 42 minutes Total: Upper Arm (Left) - 42 minutes   DICTATION: .Note written in EPIC  PLAN OF CARE: Discharge to home after PACU  PATIENT DISPOSITION:  PACU - hemodynamically stable.

## 2017-12-28 NOTE — Progress Notes (Signed)
Assisted Dr. Houser with left, ultrasound guided, supraclavicular block. Side rails up, monitors on throughout procedure. See vital signs in flow sheet. Tolerated Procedure well. °

## 2017-12-28 NOTE — Anesthesia Preprocedure Evaluation (Addendum)
Anesthesia Evaluation  Patient identified by MRN, date of birth, ID band Patient awake    Reviewed: Allergy & Precautions, NPO status , Patient's Chart, lab work & pertinent test results  Airway Mallampati: II  TM Distance: >3 FB Neck ROM: Full    Dental no notable dental hx.    Pulmonary neg pulmonary ROS, former smoker,    Pulmonary exam normal breath sounds clear to auscultation       Cardiovascular Exercise Tolerance: Good negative cardio ROS Normal cardiovascular exam Rhythm:Regular Rate:Normal     Neuro/Psych negative neurological ROS  negative psych ROS   GI/Hepatic Neg liver ROS, hiatal hernia,   Endo/Other  Hypothyroidism   Renal/GU negative Renal ROS     Musculoskeletal negative musculoskeletal ROS (+)   Abdominal   Peds  Hematology negative hematology ROS (+)   Anesthesia Other Findings   Reproductive/Obstetrics negative OB ROS                            Anesthesia Physical Anesthesia Plan  ASA: II  Anesthesia Plan: General and Regional   Post-op Pain Management:    Induction: Intravenous  PONV Risk Score and Plan: Treatment may vary due to age or medical condition  Airway Management Planned: LMA  Additional Equipment:   Intra-op Plan:   Post-operative Plan:   Informed Consent: I have reviewed the patients History and Physical, chart, labs and discussed the procedure including the risks, benefits and alternatives for the proposed anesthesia with the patient or authorized representative who has indicated his/her understanding and acceptance.     Plan Discussed with: CRNA  Anesthesia Plan Comments:         Anesthesia Quick Evaluation

## 2017-12-31 ENCOUNTER — Encounter (HOSPITAL_BASED_OUTPATIENT_CLINIC_OR_DEPARTMENT_OTHER): Payer: Self-pay | Admitting: Orthopedic Surgery

## 2018-01-01 NOTE — ED Provider Notes (Signed)
[facility] EMERGENCY DEPARTMENT Provider Note   CSN: [id number] Arrival date & time: 12/25/17  1145     History   Chief Complaint Chief Complaint  Patient presents with  . Wrist Injury    HPI Victoria Garner is a 62 y.o. female.  The history is provided by the patient and a relative.  Wrist Injury   The incident occurred 1 to 2 hours ago. The incident occurred at home. Injury mechanism: Pt tripped over her dogs leash as she was trying to let the dog outdoors. The pain is present in the left wrist. The quality of the pain is described as aching and throbbing. The pain is at a severity of 9/10. The pain is severe. The pain has been constant since the incident. Pertinent negatives include no fever. She reports no foreign bodies present. She has tried ice and rest for the symptoms. The treatment provided no relief.   Pt denies any other pain or injury including no head injury. She is currently undergoing chemo for non Hodgkins lymphoma. Past Medical History:  Diagnosis Date  . GERD (gastroesophageal reflux disease)   . Hypothyroid   . Non Hodgkin's lymphoma ([place])    current treatment    There are no active problems to display for this patient.   Past Surgical History:  Procedure Laterality Date  . CATARACT EXTRACTION    . CESAREAN SECTION    . CHOLECYSTECTOMY    . OPEN REDUCTION INTERNAL FIXATION (ORIF) DISTAL RADIAL FRACTURE Left 12/28/2017   Procedure: OPEN REDUCTION INTERNAL FIXATION (ORIF) LEFT DISTAL RADIAL FRACTURE;  Surgeon: Leanora Cover, MD;  Location: [facility];  Service: Orthopedics;  Laterality: Left;  . PORTACATH PLACEMENT    . TONSILLECTOMY       OB History   None      Home Medications    Prior to Admission medications   Medication Sig Start Date End Date Taking? Authorizing Provider  levothyroxine (SYNTHROID, LEVOTHROID) 100 MCG tablet Take 100 mcg by mouth daily before breakfast.    [provider]  omeprazole (PRILOSEC) 20  MG capsule Take 20 mg by mouth daily.    [provider]    Family History No family history on file.  Social History Social History   Tobacco Use  . Smoking status: Former Research scientist (life sciences)  . Smokeless tobacco: Never Used  Substance Use Topics  . Alcohol use: Never    Frequency: Never  . Drug use: Never     Allergies   Penicillins   Review of Systems Review of Systems  Constitutional: Negative for fever.  Musculoskeletal: Positive for arthralgias and joint swelling. Negative for myalgias.  Skin: Negative for wound.  Neurological: Negative for weakness, numbness and headaches.     Physical Exam Updated Vital Signs BP (!) 133/94 (BP Location: Right Arm)   Pulse 70   Temp 98.7 F (37.1 C) (Oral)   Resp 15   Ht 5\' 4"  (1.626 m)   Wt 89.4 kg (197 lb)   SpO2 100%   BMI 33.81 kg/m   Physical Exam  Constitutional: She appears well-developed and well-nourished.  HENT:  Head: Atraumatic.  Neck: Normal range of motion.  Cardiovascular:  Pulses equal bilaterally  Musculoskeletal: She exhibits edema, tenderness and deformity.       Left wrist: She exhibits decreased range of motion, bony tenderness, swelling and deformity.  ttp dorsal left wrist with edema and subtle dorsal deformity.  Distal sensation intact with less than 2 sec cap refill. Pt  can flex/ext finger  Radial pulse full.  Neurological: She is alert. She has normal strength. She displays normal reflexes. No sensory deficit.  Skin: Skin is warm and dry.  Psychiatric: She has a normal mood and affect.     ED Treatments / Results  Labs (all labs ordered are listed, but only abnormal results are displayed) Labs Reviewed - No data to display  EKG None  Radiology No results found.  Procedures Procedures (including critical care time)  SPLINT APPLICATION Date/Time: 6:38 AM Authorized by: Evalee Jefferson Consent: Verbal consent obtained. Risks and benefits: risks, benefits and alternatives were  discussed Consent given by: patient Splint applied by: RN Location details: left wrist Splint type: sugar tong Supplies used: webril, fiberglass, ace wrap Post-procedure: The splinted body part was neurovascularly unchanged following the procedure. Patient tolerance: Patient tolerated the procedure well with no immediate complications.     Medications Ordered in ED Medications - No data to display   Initial Impression / Assessment and Plan / ED Course  I have reviewed the triage vital signs and the nursing notes.  Pertinent labs & imaging results that were available during my care of the patient were reviewed by me and considered in my medical decision making (see chart for details).     Discussed patient with Dr Fredna Dow who reviewed films.  Sugar tong, sling, ice, elevation.  F/u with Dr. Fredna Dow in his office, call for appt within the next 1-2 days. Pt has oxycodone already, no pain meds needed.   Pt was seen by Dr. Winfred Leeds during this visit.  Final Clinical Impressions(s) / ED Diagnoses   Final diagnoses:  Other closed intra-articular fracture of distal end of left radius, initial encounter  Closed nondisplaced fracture of styloid process of left ulna, initial encounter    ED Discharge Orders    None       Landis Martins 01/01/18 0230    Orlie Dakin, MD 01/02/18 4536    Orlie Dakin, MD 01/07/18 [phone]

## 2018-01-02 DIAGNOSIS — S52572A Other intraarticular fracture of lower end of left radius, initial encounter for closed fracture: Secondary | ICD-10-CM | POA: Diagnosis not present

## 2018-01-02 DIAGNOSIS — M25632 Stiffness of left wrist, not elsewhere classified: Secondary | ICD-10-CM | POA: Diagnosis not present

## 2018-01-02 DIAGNOSIS — M25532 Pain in left wrist: Secondary | ICD-10-CM | POA: Diagnosis not present

## 2018-01-04 DIAGNOSIS — G35 Multiple sclerosis: Secondary | ICD-10-CM | POA: Diagnosis not present

## 2018-01-04 DIAGNOSIS — Z9889 Other specified postprocedural states: Secondary | ICD-10-CM | POA: Diagnosis not present

## 2018-01-04 DIAGNOSIS — C8330 Diffuse large B-cell lymphoma, unspecified site: Secondary | ICD-10-CM | POA: Diagnosis not present

## 2018-01-04 DIAGNOSIS — R221 Localized swelling, mass and lump, neck: Secondary | ICD-10-CM | POA: Diagnosis not present

## 2018-01-04 DIAGNOSIS — D709 Neutropenia, unspecified: Secondary | ICD-10-CM | POA: Diagnosis not present

## 2018-01-04 DIAGNOSIS — K639 Disease of intestine, unspecified: Secondary | ICD-10-CM | POA: Diagnosis not present

## 2018-01-04 DIAGNOSIS — Z79899 Other long term (current) drug therapy: Secondary | ICD-10-CM | POA: Diagnosis not present

## 2018-01-04 DIAGNOSIS — Z95828 Presence of other vascular implants and grafts: Secondary | ICD-10-CM | POA: Diagnosis not present

## 2018-01-04 DIAGNOSIS — G540 Brachial plexus disorders: Secondary | ICD-10-CM | POA: Diagnosis not present

## 2018-01-10 DIAGNOSIS — Z95828 Presence of other vascular implants and grafts: Secondary | ICD-10-CM | POA: Diagnosis not present

## 2018-01-10 DIAGNOSIS — D709 Neutropenia, unspecified: Secondary | ICD-10-CM | POA: Diagnosis not present

## 2018-01-10 DIAGNOSIS — G35 Multiple sclerosis: Secondary | ICD-10-CM | POA: Diagnosis not present

## 2018-01-10 DIAGNOSIS — C8331 Diffuse large B-cell lymphoma, lymph nodes of head, face, and neck: Secondary | ICD-10-CM | POA: Diagnosis not present

## 2018-01-10 DIAGNOSIS — Z8669 Personal history of other diseases of the nervous system and sense organs: Secondary | ICD-10-CM | POA: Diagnosis not present

## 2018-01-10 DIAGNOSIS — Z9889 Other specified postprocedural states: Secondary | ICD-10-CM | POA: Diagnosis not present

## 2018-01-23 DIAGNOSIS — S52572D Other intraarticular fracture of lower end of left radius, subsequent encounter for closed fracture with routine healing: Secondary | ICD-10-CM | POA: Diagnosis not present

## 2018-01-31 DIAGNOSIS — K6389 Other specified diseases of intestine: Secondary | ICD-10-CM | POA: Diagnosis not present

## 2018-01-31 DIAGNOSIS — Z880 Allergy status to penicillin: Secondary | ICD-10-CM | POA: Diagnosis not present

## 2018-01-31 DIAGNOSIS — Z95828 Presence of other vascular implants and grafts: Secondary | ICD-10-CM | POA: Diagnosis not present

## 2018-01-31 DIAGNOSIS — G35 Multiple sclerosis: Secondary | ICD-10-CM | POA: Diagnosis not present

## 2018-01-31 DIAGNOSIS — C8330 Diffuse large B-cell lymphoma, unspecified site: Secondary | ICD-10-CM | POA: Diagnosis not present

## 2018-01-31 DIAGNOSIS — E039 Hypothyroidism, unspecified: Secondary | ICD-10-CM | POA: Diagnosis not present

## 2018-01-31 DIAGNOSIS — G540 Brachial plexus disorders: Secondary | ICD-10-CM | POA: Diagnosis not present

## 2018-01-31 DIAGNOSIS — D709 Neutropenia, unspecified: Secondary | ICD-10-CM | POA: Diagnosis not present

## 2018-01-31 DIAGNOSIS — C8331 Diffuse large B-cell lymphoma, lymph nodes of head, face, and neck: Secondary | ICD-10-CM | POA: Diagnosis not present

## 2018-02-13 DIAGNOSIS — S52572D Other intraarticular fracture of lower end of left radius, subsequent encounter for closed fracture with routine healing: Secondary | ICD-10-CM | POA: Diagnosis not present

## 2018-03-04 DIAGNOSIS — C8332 Diffuse large B-cell lymphoma, intrathoracic lymph nodes: Secondary | ICD-10-CM | POA: Diagnosis not present

## 2018-03-04 DIAGNOSIS — Z95828 Presence of other vascular implants and grafts: Secondary | ICD-10-CM | POA: Diagnosis not present

## 2018-03-04 DIAGNOSIS — C8334 Diffuse large B-cell lymphoma, lymph nodes of axilla and upper limb: Secondary | ICD-10-CM | POA: Diagnosis not present

## 2018-03-04 DIAGNOSIS — E039 Hypothyroidism, unspecified: Secondary | ICD-10-CM | POA: Diagnosis not present

## 2018-03-04 DIAGNOSIS — Z5111 Encounter for antineoplastic chemotherapy: Secondary | ICD-10-CM | POA: Diagnosis not present

## 2018-03-04 DIAGNOSIS — Z880 Allergy status to penicillin: Secondary | ICD-10-CM | POA: Diagnosis not present

## 2018-03-04 DIAGNOSIS — K219 Gastro-esophageal reflux disease without esophagitis: Secondary | ICD-10-CM | POA: Diagnosis not present

## 2018-03-04 DIAGNOSIS — C8330 Diffuse large B-cell lymphoma, unspecified site: Secondary | ICD-10-CM | POA: Diagnosis not present

## 2018-03-04 DIAGNOSIS — Z87891 Personal history of nicotine dependence: Secondary | ICD-10-CM | POA: Diagnosis not present

## 2018-03-05 DIAGNOSIS — Z5111 Encounter for antineoplastic chemotherapy: Secondary | ICD-10-CM | POA: Diagnosis not present

## 2018-03-05 DIAGNOSIS — Z95828 Presence of other vascular implants and grafts: Secondary | ICD-10-CM | POA: Diagnosis not present

## 2018-03-05 DIAGNOSIS — C8332 Diffuse large B-cell lymphoma, intrathoracic lymph nodes: Secondary | ICD-10-CM | POA: Diagnosis not present

## 2018-03-06 DIAGNOSIS — Z95828 Presence of other vascular implants and grafts: Secondary | ICD-10-CM | POA: Diagnosis not present

## 2018-03-06 DIAGNOSIS — Z5111 Encounter for antineoplastic chemotherapy: Secondary | ICD-10-CM | POA: Diagnosis not present

## 2018-03-06 DIAGNOSIS — C8332 Diffuse large B-cell lymphoma, intrathoracic lymph nodes: Secondary | ICD-10-CM | POA: Diagnosis not present

## 2018-03-07 DIAGNOSIS — C8330 Diffuse large B-cell lymphoma, unspecified site: Secondary | ICD-10-CM | POA: Diagnosis not present

## 2018-03-18 DIAGNOSIS — C8331 Diffuse large B-cell lymphoma, lymph nodes of head, face, and neck: Secondary | ICD-10-CM | POA: Diagnosis not present

## 2018-03-20 DIAGNOSIS — S52572D Other intraarticular fracture of lower end of left radius, subsequent encounter for closed fracture with routine healing: Secondary | ICD-10-CM | POA: Diagnosis not present

## 2018-03-25 DIAGNOSIS — D72819 Decreased white blood cell count, unspecified: Secondary | ICD-10-CM | POA: Diagnosis not present

## 2018-03-25 DIAGNOSIS — Z79890 Hormone replacement therapy (postmenopausal): Secondary | ICD-10-CM | POA: Diagnosis not present

## 2018-03-25 DIAGNOSIS — Z95828 Presence of other vascular implants and grafts: Secondary | ICD-10-CM | POA: Diagnosis not present

## 2018-03-25 DIAGNOSIS — C8330 Diffuse large B-cell lymphoma, unspecified site: Secondary | ICD-10-CM | POA: Diagnosis not present

## 2018-03-25 DIAGNOSIS — C8331 Diffuse large B-cell lymphoma, lymph nodes of head, face, and neck: Secondary | ICD-10-CM | POA: Diagnosis not present

## 2018-03-25 DIAGNOSIS — T451X5A Adverse effect of antineoplastic and immunosuppressive drugs, initial encounter: Secondary | ICD-10-CM | POA: Diagnosis not present

## 2018-03-25 DIAGNOSIS — E039 Hypothyroidism, unspecified: Secondary | ICD-10-CM | POA: Diagnosis not present

## 2018-03-25 DIAGNOSIS — D6481 Anemia due to antineoplastic chemotherapy: Secondary | ICD-10-CM | POA: Diagnosis not present

## 2018-03-25 DIAGNOSIS — K219 Gastro-esophageal reflux disease without esophagitis: Secondary | ICD-10-CM | POA: Diagnosis not present

## 2018-03-25 DIAGNOSIS — Z5111 Encounter for antineoplastic chemotherapy: Secondary | ICD-10-CM | POA: Diagnosis not present

## 2018-03-26 DIAGNOSIS — C8331 Diffuse large B-cell lymphoma, lymph nodes of head, face, and neck: Secondary | ICD-10-CM | POA: Diagnosis not present

## 2018-03-26 DIAGNOSIS — E039 Hypothyroidism, unspecified: Secondary | ICD-10-CM | POA: Diagnosis not present

## 2018-03-26 DIAGNOSIS — D6481 Anemia due to antineoplastic chemotherapy: Secondary | ICD-10-CM | POA: Diagnosis not present

## 2018-03-26 DIAGNOSIS — Z95828 Presence of other vascular implants and grafts: Secondary | ICD-10-CM | POA: Diagnosis not present

## 2018-03-26 DIAGNOSIS — Z5111 Encounter for antineoplastic chemotherapy: Secondary | ICD-10-CM | POA: Diagnosis not present

## 2018-03-27 DIAGNOSIS — C8331 Diffuse large B-cell lymphoma, lymph nodes of head, face, and neck: Secondary | ICD-10-CM | POA: Diagnosis not present

## 2018-03-27 DIAGNOSIS — Z5111 Encounter for antineoplastic chemotherapy: Secondary | ICD-10-CM | POA: Diagnosis not present

## 2018-03-27 DIAGNOSIS — Z95828 Presence of other vascular implants and grafts: Secondary | ICD-10-CM | POA: Diagnosis not present

## 2018-03-27 DIAGNOSIS — E039 Hypothyroidism, unspecified: Secondary | ICD-10-CM | POA: Diagnosis not present

## 2018-03-28 DIAGNOSIS — E039 Hypothyroidism, unspecified: Secondary | ICD-10-CM | POA: Diagnosis not present

## 2018-03-28 DIAGNOSIS — C8331 Diffuse large B-cell lymphoma, lymph nodes of head, face, and neck: Secondary | ICD-10-CM | POA: Diagnosis not present

## 2018-03-28 DIAGNOSIS — D6481 Anemia due to antineoplastic chemotherapy: Secondary | ICD-10-CM | POA: Diagnosis not present

## 2018-03-28 DIAGNOSIS — Z95828 Presence of other vascular implants and grafts: Secondary | ICD-10-CM | POA: Diagnosis not present

## 2018-04-08 DIAGNOSIS — C8338 Diffuse large B-cell lymphoma, lymph nodes of multiple sites: Secondary | ICD-10-CM | POA: Diagnosis not present

## 2018-04-08 DIAGNOSIS — C8330 Diffuse large B-cell lymphoma, unspecified site: Secondary | ICD-10-CM | POA: Diagnosis not present

## 2018-04-08 DIAGNOSIS — E039 Hypothyroidism, unspecified: Secondary | ICD-10-CM | POA: Diagnosis not present

## 2018-04-08 DIAGNOSIS — R221 Localized swelling, mass and lump, neck: Secondary | ICD-10-CM | POA: Diagnosis not present

## 2018-04-11 DIAGNOSIS — K6389 Other specified diseases of intestine: Secondary | ICD-10-CM | POA: Diagnosis not present

## 2018-04-11 DIAGNOSIS — R590 Localized enlarged lymph nodes: Secondary | ICD-10-CM | POA: Diagnosis not present

## 2018-04-11 DIAGNOSIS — C8330 Diffuse large B-cell lymphoma, unspecified site: Secondary | ICD-10-CM | POA: Diagnosis not present

## 2018-04-11 DIAGNOSIS — Z95828 Presence of other vascular implants and grafts: Secondary | ICD-10-CM | POA: Diagnosis not present

## 2018-04-11 DIAGNOSIS — G35 Multiple sclerosis: Secondary | ICD-10-CM | POA: Diagnosis not present

## 2018-04-18 DIAGNOSIS — E039 Hypothyroidism, unspecified: Secondary | ICD-10-CM | POA: Diagnosis not present

## 2018-04-18 DIAGNOSIS — E669 Obesity, unspecified: Secondary | ICD-10-CM | POA: Diagnosis not present

## 2018-04-18 DIAGNOSIS — E038 Other specified hypothyroidism: Secondary | ICD-10-CM | POA: Diagnosis not present

## 2018-04-18 DIAGNOSIS — S6290XD Unspecified fracture of unspecified wrist and hand, subsequent encounter for fracture with routine healing: Secondary | ICD-10-CM | POA: Diagnosis not present

## 2018-04-18 DIAGNOSIS — K219 Gastro-esophageal reflux disease without esophagitis: Secondary | ICD-10-CM | POA: Diagnosis not present

## 2018-04-18 DIAGNOSIS — C8590 Non-Hodgkin lymphoma, unspecified, unspecified site: Secondary | ICD-10-CM | POA: Diagnosis not present

## 2018-05-21 DIAGNOSIS — C8330 Diffuse large B-cell lymphoma, unspecified site: Secondary | ICD-10-CM | POA: Diagnosis not present

## 2018-05-21 DIAGNOSIS — E042 Nontoxic multinodular goiter: Secondary | ICD-10-CM | POA: Diagnosis not present

## 2018-05-21 DIAGNOSIS — C8338 Diffuse large B-cell lymphoma, lymph nodes of multiple sites: Secondary | ICD-10-CM | POA: Diagnosis not present

## 2018-05-23 DIAGNOSIS — G35 Multiple sclerosis: Secondary | ICD-10-CM | POA: Diagnosis not present

## 2018-05-23 DIAGNOSIS — Z95828 Presence of other vascular implants and grafts: Secondary | ICD-10-CM | POA: Diagnosis not present

## 2018-05-23 DIAGNOSIS — E039 Hypothyroidism, unspecified: Secondary | ICD-10-CM | POA: Diagnosis not present

## 2018-05-23 DIAGNOSIS — Z9221 Personal history of antineoplastic chemotherapy: Secondary | ICD-10-CM | POA: Diagnosis not present

## 2018-05-23 DIAGNOSIS — C8330 Diffuse large B-cell lymphoma, unspecified site: Secondary | ICD-10-CM | POA: Diagnosis not present

## 2018-05-27 DIAGNOSIS — E039 Hypothyroidism, unspecified: Secondary | ICD-10-CM | POA: Diagnosis not present

## 2018-05-27 DIAGNOSIS — S6290XD Unspecified fracture of unspecified wrist and hand, subsequent encounter for fracture with routine healing: Secondary | ICD-10-CM | POA: Diagnosis not present

## 2018-05-27 DIAGNOSIS — K219 Gastro-esophageal reflux disease without esophagitis: Secondary | ICD-10-CM | POA: Diagnosis not present

## 2018-05-27 DIAGNOSIS — C8590 Non-Hodgkin lymphoma, unspecified, unspecified site: Secondary | ICD-10-CM | POA: Diagnosis not present

## 2018-05-30 DIAGNOSIS — C8590 Non-Hodgkin lymphoma, unspecified, unspecified site: Secondary | ICD-10-CM | POA: Diagnosis not present

## 2018-05-30 DIAGNOSIS — K219 Gastro-esophageal reflux disease without esophagitis: Secondary | ICD-10-CM | POA: Diagnosis not present

## 2018-05-30 DIAGNOSIS — E039 Hypothyroidism, unspecified: Secondary | ICD-10-CM | POA: Diagnosis not present

## 2018-05-30 DIAGNOSIS — S6290XD Unspecified fracture of unspecified wrist and hand, subsequent encounter for fracture with routine healing: Secondary | ICD-10-CM | POA: Diagnosis not present

## 2018-06-07 ENCOUNTER — Other Ambulatory Visit (HOSPITAL_COMMUNITY): Payer: Self-pay | Admitting: Internal Medicine

## 2018-06-07 DIAGNOSIS — Z780 Asymptomatic menopausal state: Secondary | ICD-10-CM

## 2018-06-08 DIAGNOSIS — R42 Dizziness and giddiness: Secondary | ICD-10-CM | POA: Diagnosis not present

## 2018-06-08 DIAGNOSIS — I491 Atrial premature depolarization: Secondary | ICD-10-CM | POA: Diagnosis not present

## 2018-06-08 DIAGNOSIS — I4510 Unspecified right bundle-branch block: Secondary | ICD-10-CM | POA: Diagnosis not present

## 2018-06-08 DIAGNOSIS — R000 Tachycardia, unspecified: Secondary | ICD-10-CM | POA: Diagnosis not present

## 2018-06-13 ENCOUNTER — Ambulatory Visit (HOSPITAL_COMMUNITY)
Admission: RE | Admit: 2018-06-13 | Discharge: 2018-06-13 | Disposition: A | Discharge status: Home / Self Care | Payer: BLUE CROSS/BLUE SHIELD | Source: Ambulatory Visit | Attending: Internal Medicine | Admitting: Internal Medicine

## 2018-06-13 DIAGNOSIS — M8589 Other specified disorders of bone density and structure, multiple sites: Secondary | ICD-10-CM | POA: Insufficient documentation

## 2018-06-13 DIAGNOSIS — Z780 Asymptomatic menopausal state: Secondary | ICD-10-CM | POA: Insufficient documentation

## 2018-06-13 DIAGNOSIS — Z13820 Encounter for screening for osteoporosis: Secondary | ICD-10-CM | POA: Insufficient documentation

## 2018-08-27 DIAGNOSIS — J019 Acute sinusitis, unspecified: Secondary | ICD-10-CM | POA: Diagnosis not present

## 2018-08-29 DIAGNOSIS — E039 Hypothyroidism, unspecified: Secondary | ICD-10-CM | POA: Diagnosis not present

## 2018-08-29 DIAGNOSIS — J324 Chronic pansinusitis: Secondary | ICD-10-CM | POA: Diagnosis not present

## 2018-08-29 DIAGNOSIS — C8330 Diffuse large B-cell lymphoma, unspecified site: Secondary | ICD-10-CM | POA: Diagnosis not present

## 2018-08-29 DIAGNOSIS — C8338 Diffuse large B-cell lymphoma, lymph nodes of multiple sites: Secondary | ICD-10-CM | POA: Diagnosis not present

## 2018-08-29 DIAGNOSIS — E876 Hypokalemia: Secondary | ICD-10-CM | POA: Diagnosis not present

## 2018-11-28 DIAGNOSIS — Z79899 Other long term (current) drug therapy: Secondary | ICD-10-CM | POA: Diagnosis not present

## 2018-11-28 DIAGNOSIS — C8330 Diffuse large B-cell lymphoma, unspecified site: Secondary | ICD-10-CM | POA: Diagnosis not present

## 2018-11-28 DIAGNOSIS — C8338 Diffuse large B-cell lymphoma, lymph nodes of multiple sites: Secondary | ICD-10-CM | POA: Diagnosis not present

## 2018-11-28 DIAGNOSIS — Z8669 Personal history of other diseases of the nervous system and sense organs: Secondary | ICD-10-CM | POA: Diagnosis not present

## 2018-12-23 DIAGNOSIS — K219 Gastro-esophageal reflux disease without esophagitis: Secondary | ICD-10-CM | POA: Diagnosis not present

## 2018-12-23 DIAGNOSIS — E039 Hypothyroidism, unspecified: Secondary | ICD-10-CM | POA: Diagnosis not present

## 2018-12-23 DIAGNOSIS — C8590 Non-Hodgkin lymphoma, unspecified, unspecified site: Secondary | ICD-10-CM | POA: Diagnosis not present

## 2019-02-27 DIAGNOSIS — C8330 Diffuse large B-cell lymphoma, unspecified site: Secondary | ICD-10-CM | POA: Diagnosis not present

## 2019-03-20 DIAGNOSIS — E039 Hypothyroidism, unspecified: Secondary | ICD-10-CM | POA: Diagnosis not present

## 2019-03-20 DIAGNOSIS — Z6831 Body mass index (BMI) 31.0-31.9, adult: Secondary | ICD-10-CM | POA: Diagnosis not present

## 2019-04-01 DIAGNOSIS — C8590 Non-Hodgkin lymphoma, unspecified, unspecified site: Secondary | ICD-10-CM | POA: Diagnosis not present

## 2019-04-01 DIAGNOSIS — Z124 Encounter for screening for malignant neoplasm of cervix: Secondary | ICD-10-CM | POA: Diagnosis not present

## 2019-04-01 DIAGNOSIS — E039 Hypothyroidism, unspecified: Secondary | ICD-10-CM | POA: Diagnosis not present

## 2019-04-01 DIAGNOSIS — Z0001 Encounter for general adult medical examination with abnormal findings: Secondary | ICD-10-CM | POA: Diagnosis not present

## 2019-04-01 DIAGNOSIS — K219 Gastro-esophageal reflux disease without esophagitis: Secondary | ICD-10-CM | POA: Diagnosis not present

## 2019-04-01 DIAGNOSIS — S6290XD Unspecified fracture of unspecified wrist and hand, subsequent encounter for fracture with routine healing: Secondary | ICD-10-CM | POA: Diagnosis not present

## 2019-04-23 IMAGING — CR DG FOREARM 2V*L*
2 series · 2 of 2 positions shown · non-contrast
Comparison: Left wrist series of today's date

CLINICAL DATA: Patient fell today and has left wrist pain radiating
into the mid forearm.

EXAM:
LEFT FOREARM - 2 VIEW

[x forearm lat left]
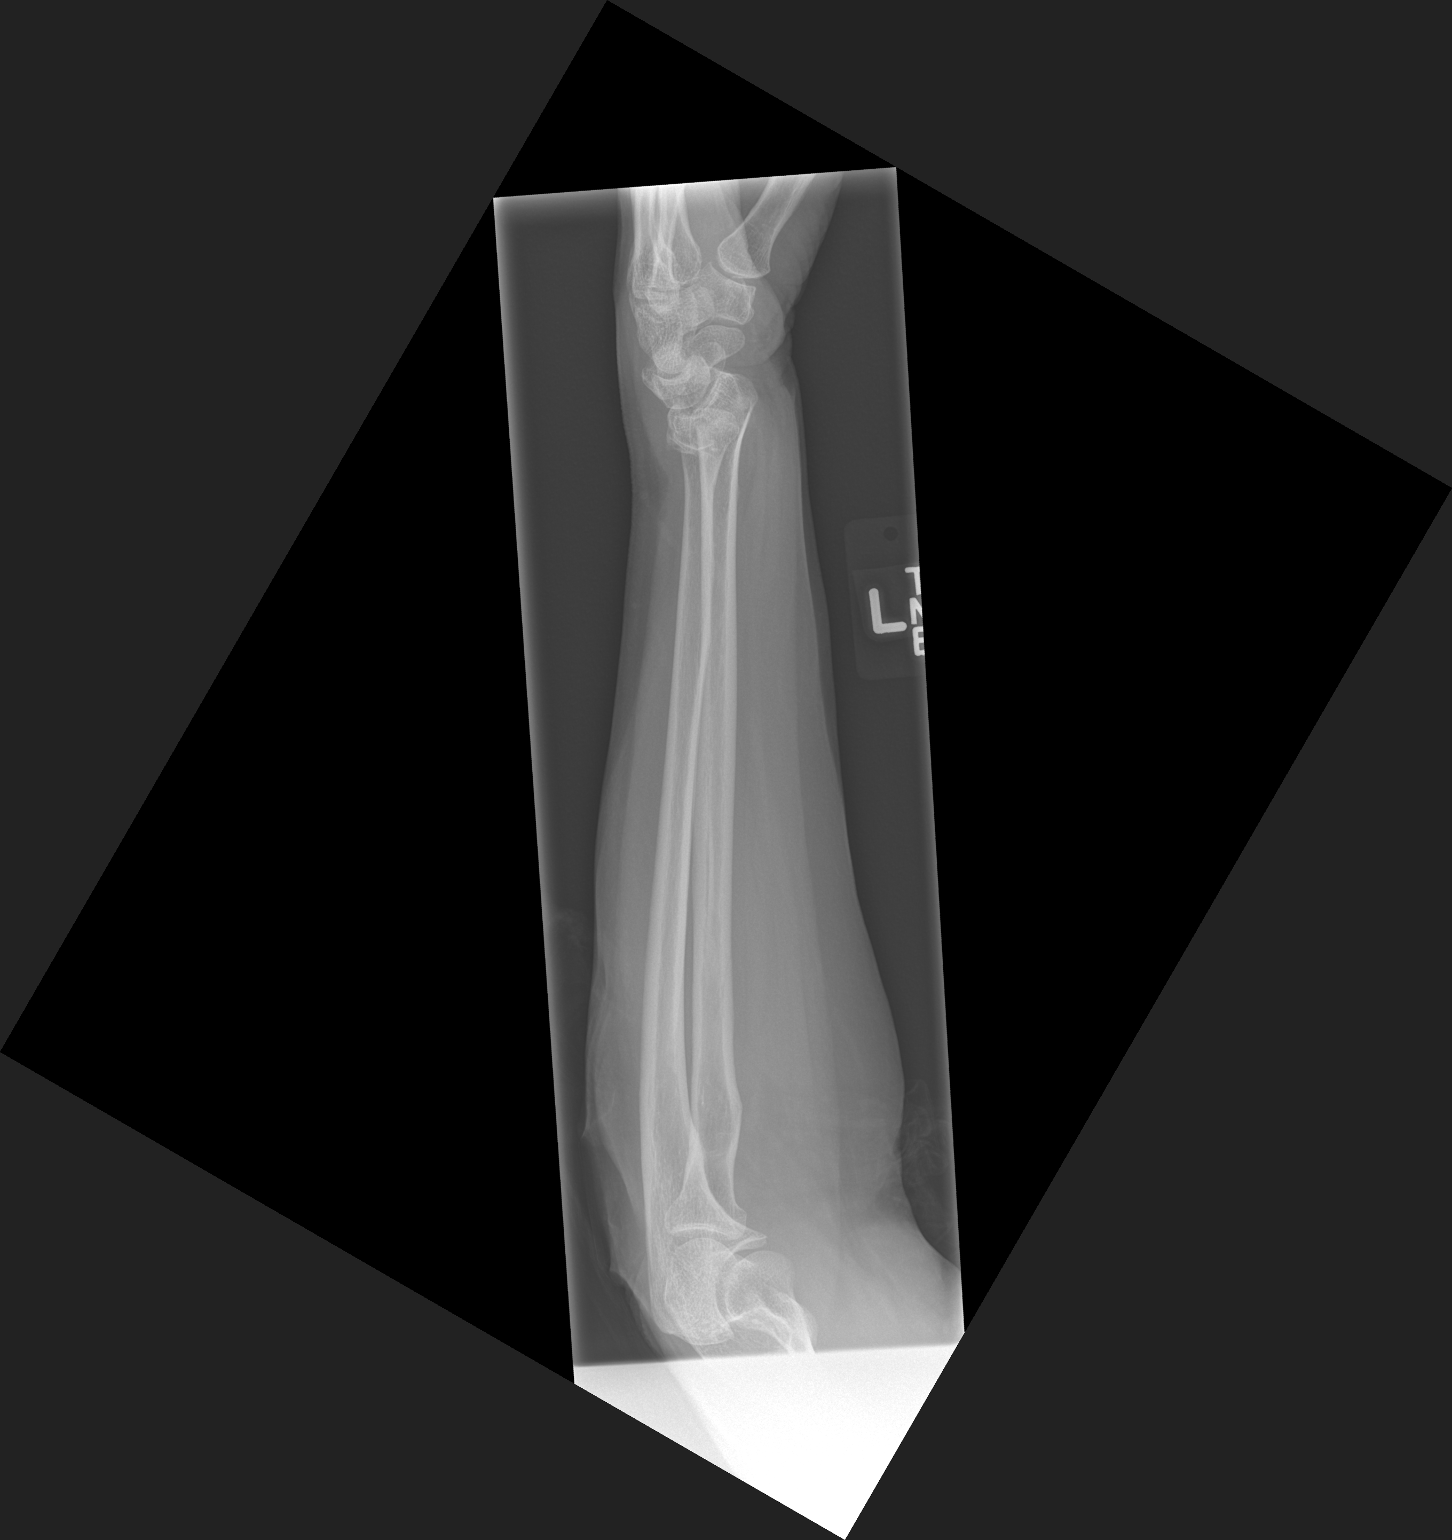

[x forearm ap left]
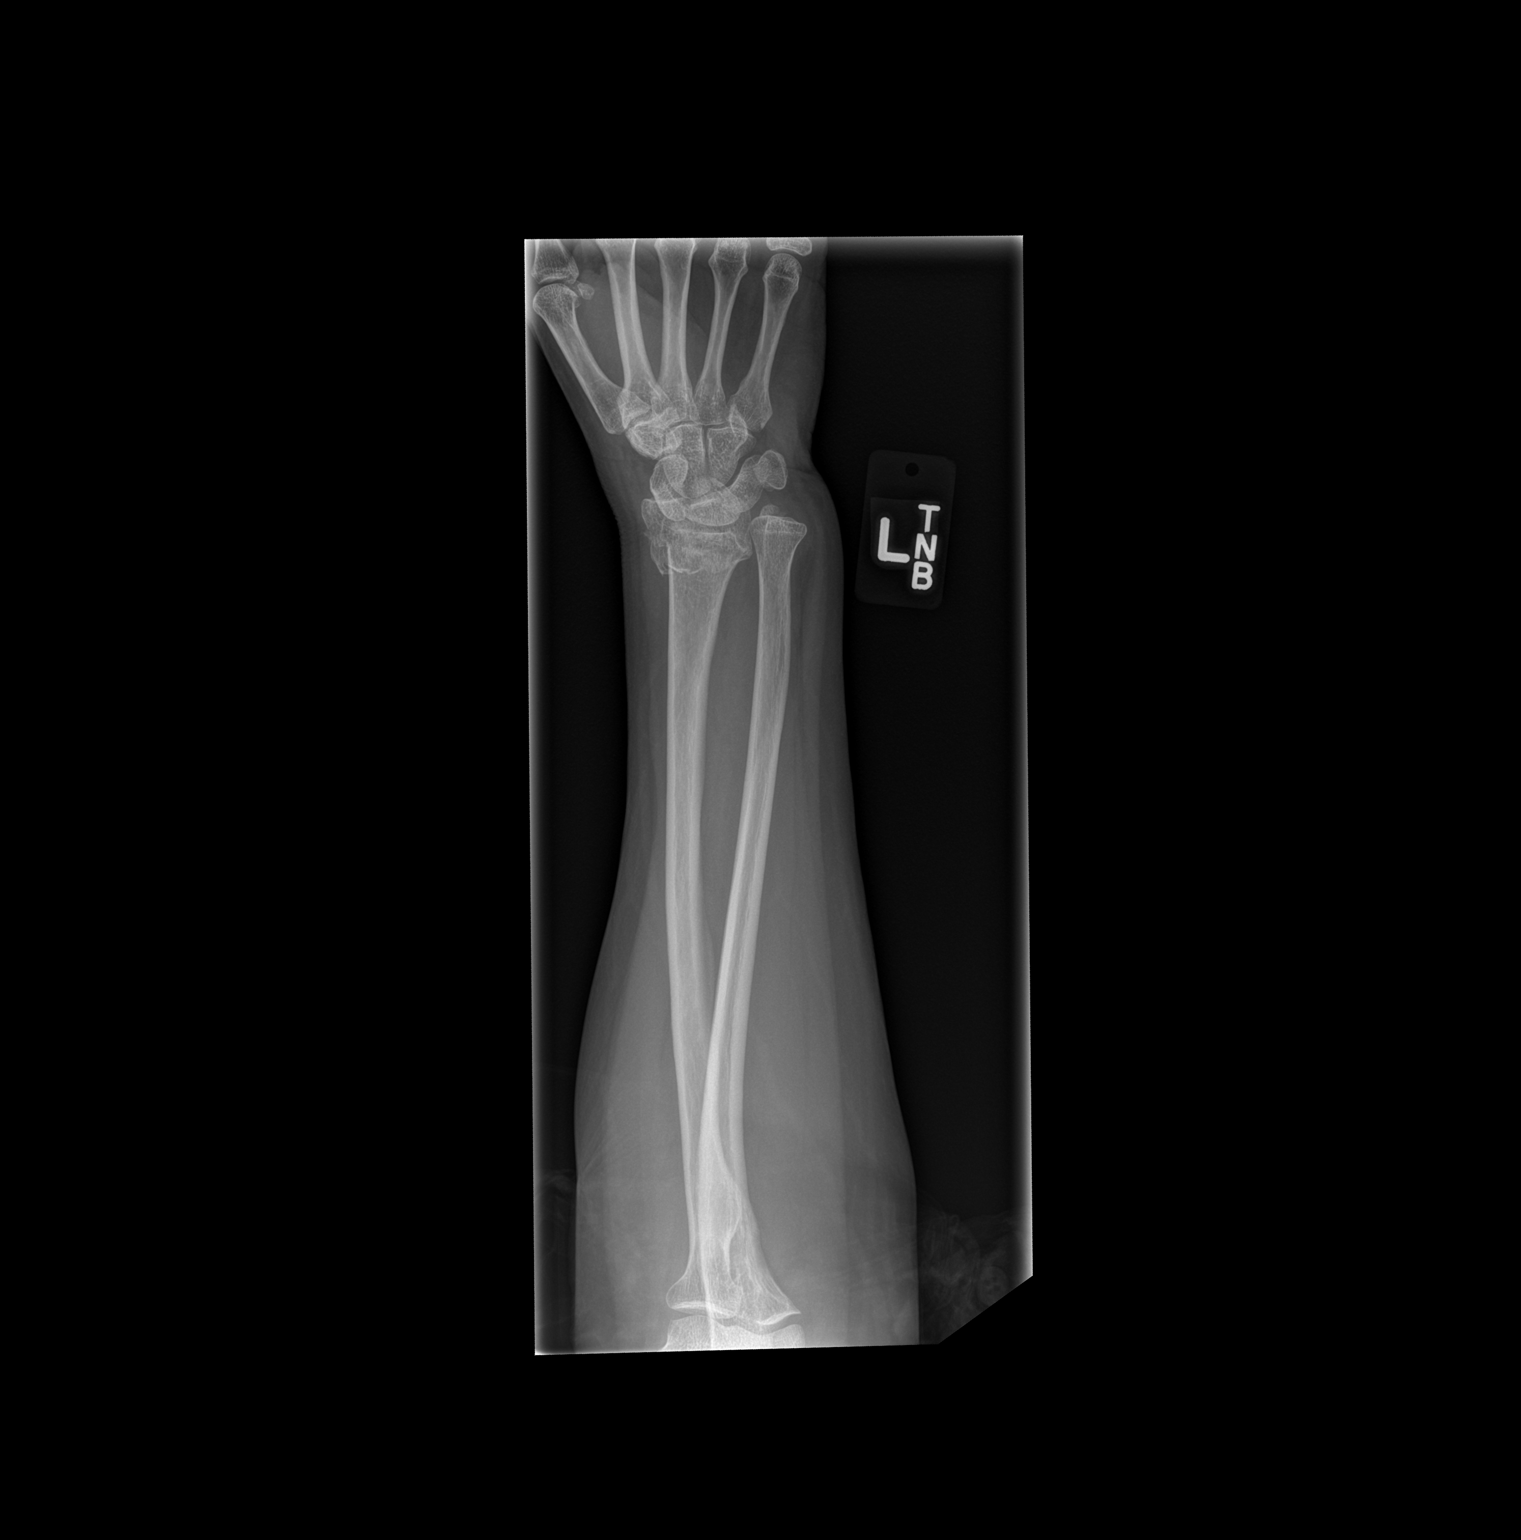

[2 of 2 positions shown; findings below may reference images not displayed]

FINDINGS: There is a fracture of the distal left radial metaphysis which is
angulated, comminuted and intra-articular in nature. There is an
avulsion of the ulnar styloid. More proximally the shafts of the
radius and ulna are intact. Limited visualization of the elbow
reveals no acute abnormality. There is soft tissue swelling about
the wrist.
IMPRESSION: Acute fractures of the distal radial metaphysis and ulnar styloid.
No acute diaphyseal fractures.

## 2019-04-23 IMAGING — CR DG WRIST COMPLETE 3+V*L*
3 series · 3 of 3 positions shown · non-contrast
Comparison: Right forearm radiographs of today's date

CLINICAL DATA: Status post fall today with persistent wrist and
forearm pain

EXAM:
LEFT WRIST - COMPLETE 3+ VIEW

[x wrist pa left]
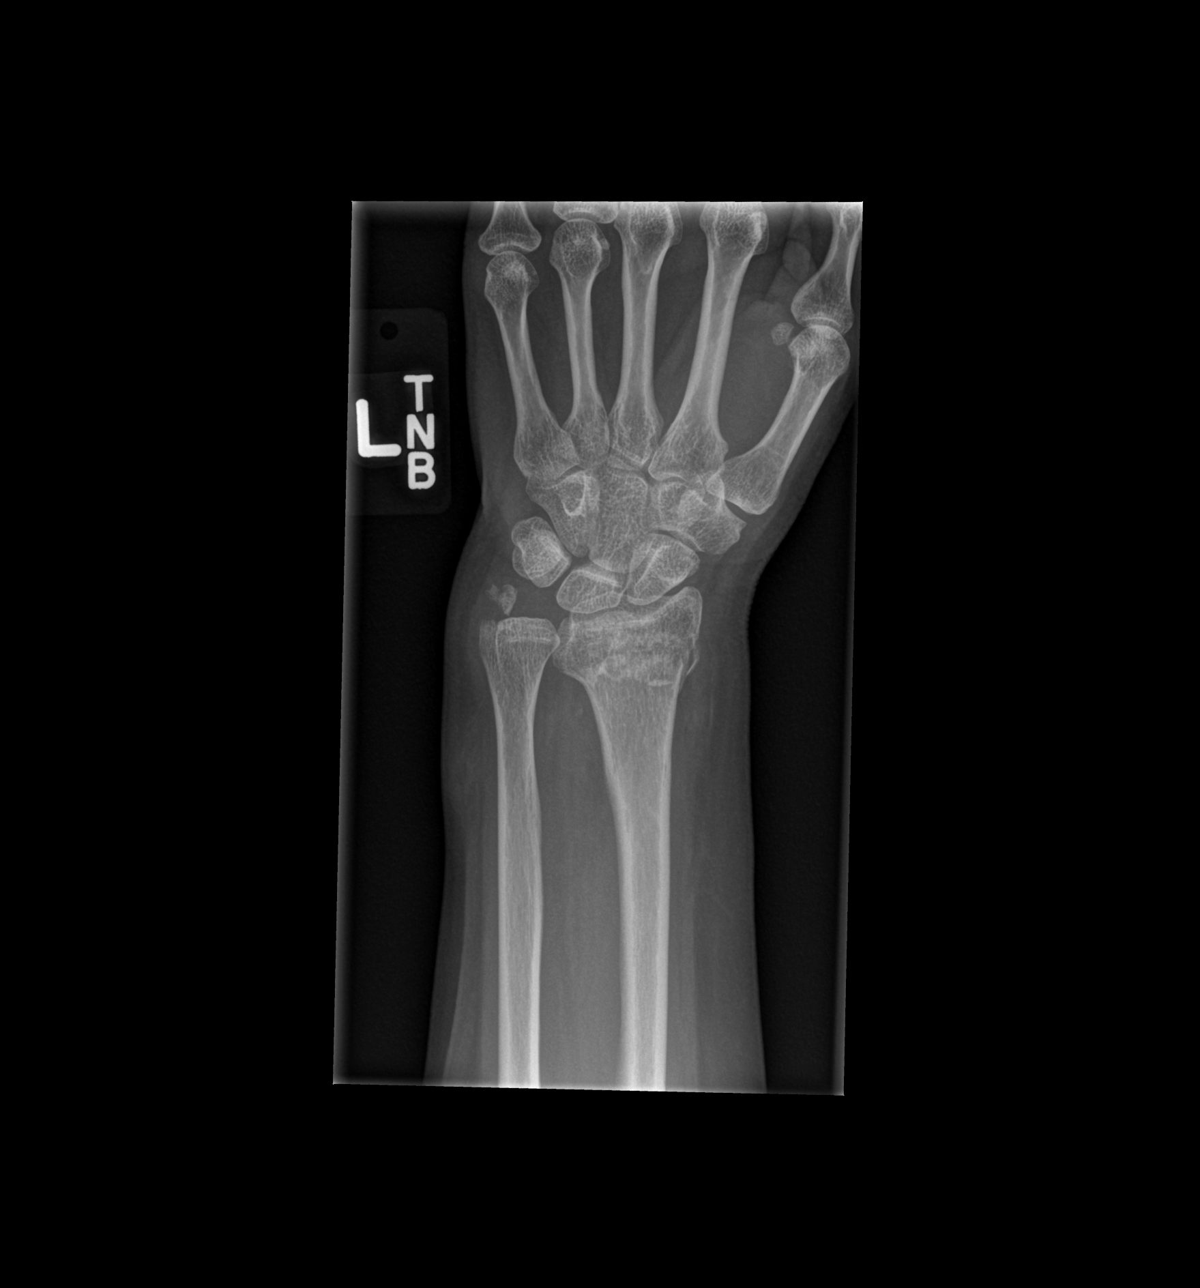

[x wrist obl left]
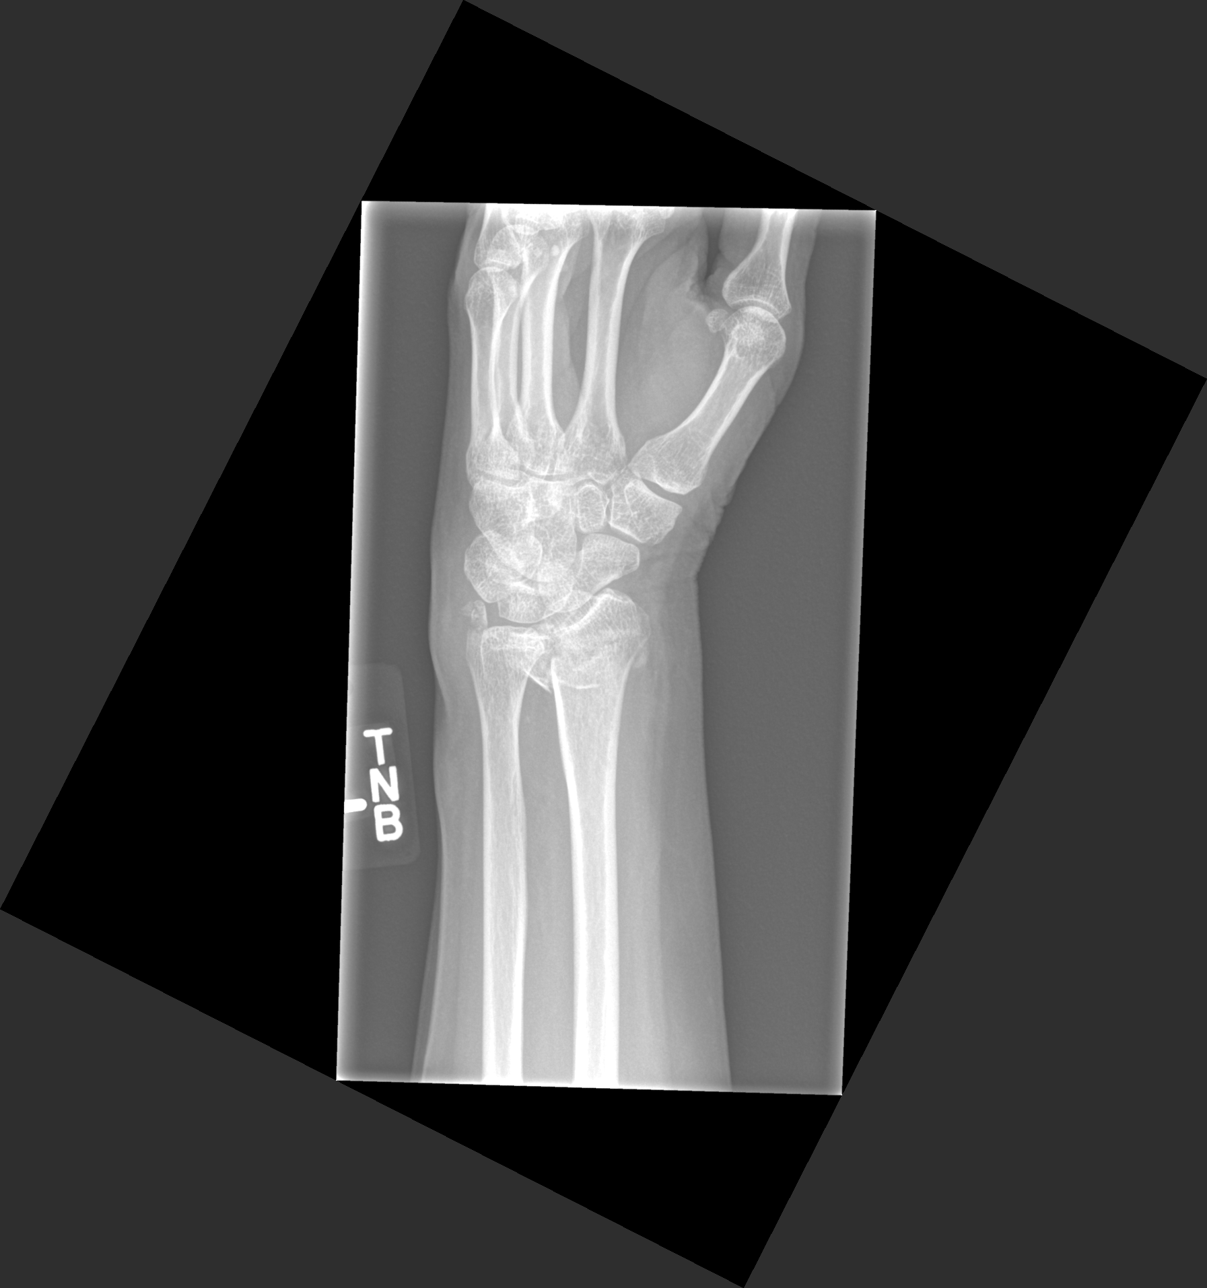

[x wrist lat left]
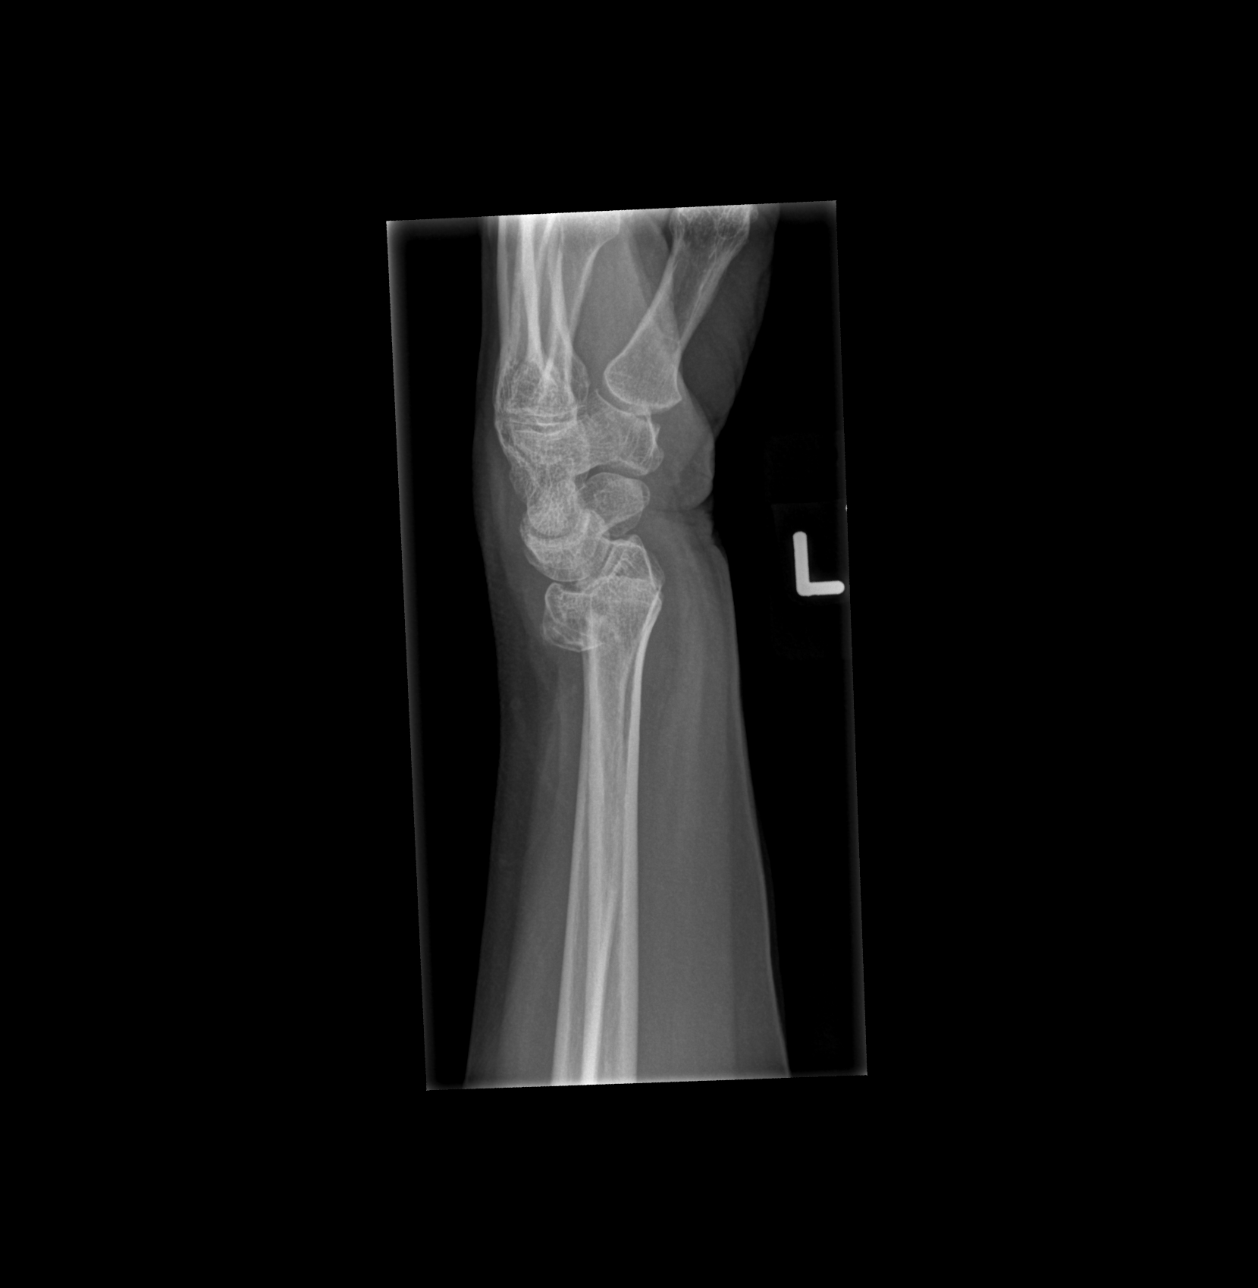

[3 of 3 positions shown; findings below may reference images not displayed]

FINDINGS: There is an acute impacted and angulated fracture of the distal
radial metaphysis. The apex of the angle is ventral. There is
avulsion of the ulnar styloid. The distal radial fracture line
extends to the articular surface. The carpal bones and metacarpals
are intact where visualized. There is overlying soft tissue
swelling.
IMPRESSION: There is an acute impacted distal right radial metaphyseal fracture
with an intra-articular component. There is an adjacent avulsion
fracture of the ulnar styloid.

## 2019-05-27 DIAGNOSIS — C8331 Diffuse large B-cell lymphoma, lymph nodes of head, face, and neck: Secondary | ICD-10-CM | POA: Diagnosis not present

## 2019-05-27 DIAGNOSIS — C8330 Diffuse large B-cell lymphoma, unspecified site: Secondary | ICD-10-CM | POA: Diagnosis not present

## 2019-05-27 DIAGNOSIS — N2889 Other specified disorders of kidney and ureter: Secondary | ICD-10-CM | POA: Diagnosis not present

## 2019-05-27 DIAGNOSIS — N289 Disorder of kidney and ureter, unspecified: Secondary | ICD-10-CM | POA: Diagnosis not present

## 2019-05-27 DIAGNOSIS — E0789 Other specified disorders of thyroid: Secondary | ICD-10-CM | POA: Diagnosis not present

## 2019-06-02 DIAGNOSIS — C8330 Diffuse large B-cell lymphoma, unspecified site: Secondary | ICD-10-CM | POA: Diagnosis not present

## 2019-06-02 DIAGNOSIS — Z452 Encounter for adjustment and management of vascular access device: Secondary | ICD-10-CM | POA: Diagnosis not present

## 2019-06-06 DIAGNOSIS — N3281 Overactive bladder: Secondary | ICD-10-CM | POA: Diagnosis not present

## 2019-06-06 DIAGNOSIS — L03818 Cellulitis of other sites: Secondary | ICD-10-CM | POA: Diagnosis not present

## 2019-06-06 DIAGNOSIS — W57XXXA Bitten or stung by nonvenomous insect and other nonvenomous arthropods, initial encounter: Secondary | ICD-10-CM | POA: Diagnosis not present

## 2019-06-09 DIAGNOSIS — N2889 Other specified disorders of kidney and ureter: Secondary | ICD-10-CM | POA: Diagnosis not present

## 2019-07-11 DIAGNOSIS — N2889 Other specified disorders of kidney and ureter: Secondary | ICD-10-CM | POA: Diagnosis not present

## 2019-07-11 DIAGNOSIS — K8689 Other specified diseases of pancreas: Secondary | ICD-10-CM | POA: Diagnosis not present

## 2019-07-11 DIAGNOSIS — K449 Diaphragmatic hernia without obstruction or gangrene: Secondary | ICD-10-CM | POA: Diagnosis not present

## 2019-09-25 DIAGNOSIS — R93429 Abnormal radiologic findings on diagnostic imaging of unspecified kidney: Secondary | ICD-10-CM | POA: Diagnosis not present

## 2019-09-25 DIAGNOSIS — Z23 Encounter for immunization: Secondary | ICD-10-CM | POA: Diagnosis not present

## 2019-09-25 DIAGNOSIS — G540 Brachial plexus disorders: Secondary | ICD-10-CM | POA: Diagnosis not present

## 2019-09-25 DIAGNOSIS — Z8739 Personal history of other diseases of the musculoskeletal system and connective tissue: Secondary | ICD-10-CM | POA: Diagnosis not present

## 2019-09-25 DIAGNOSIS — C8330 Diffuse large B-cell lymphoma, unspecified site: Secondary | ICD-10-CM | POA: Diagnosis not present

## 2019-09-25 DIAGNOSIS — C8331 Diffuse large B-cell lymphoma, lymph nodes of head, face, and neck: Secondary | ICD-10-CM | POA: Diagnosis not present

## 2019-09-25 DIAGNOSIS — E042 Nontoxic multinodular goiter: Secondary | ICD-10-CM | POA: Diagnosis not present

## 2019-10-06 DIAGNOSIS — E782 Mixed hyperlipidemia: Secondary | ICD-10-CM | POA: Diagnosis not present

## 2019-10-06 DIAGNOSIS — C8590 Non-Hodgkin lymphoma, unspecified, unspecified site: Secondary | ICD-10-CM | POA: Diagnosis not present

## 2019-10-06 DIAGNOSIS — E039 Hypothyroidism, unspecified: Secondary | ICD-10-CM | POA: Diagnosis not present

## 2019-10-10 DIAGNOSIS — C8590 Non-Hodgkin lymphoma, unspecified, unspecified site: Secondary | ICD-10-CM | POA: Diagnosis not present

## 2019-10-10 DIAGNOSIS — E039 Hypothyroidism, unspecified: Secondary | ICD-10-CM | POA: Diagnosis not present

## 2019-10-10 DIAGNOSIS — E782 Mixed hyperlipidemia: Secondary | ICD-10-CM | POA: Diagnosis not present

## 2019-10-10 DIAGNOSIS — K219 Gastro-esophageal reflux disease without esophagitis: Secondary | ICD-10-CM | POA: Diagnosis not present

## 2019-10-10 DIAGNOSIS — E6609 Other obesity due to excess calories: Secondary | ICD-10-CM | POA: Diagnosis not present

## 2020-01-20 DIAGNOSIS — G540 Brachial plexus disorders: Secondary | ICD-10-CM | POA: Diagnosis not present

## 2020-01-20 DIAGNOSIS — C8338 Diffuse large B-cell lymphoma, lymph nodes of multiple sites: Secondary | ICD-10-CM | POA: Diagnosis not present

## 2020-01-22 DIAGNOSIS — C8330 Diffuse large B-cell lymphoma, unspecified site: Secondary | ICD-10-CM | POA: Diagnosis not present

## 2020-01-22 DIAGNOSIS — C8331 Diffuse large B-cell lymphoma, lymph nodes of head, face, and neck: Secondary | ICD-10-CM | POA: Diagnosis not present

## 2020-01-22 DIAGNOSIS — E042 Nontoxic multinodular goiter: Secondary | ICD-10-CM | POA: Diagnosis not present

## 2020-01-22 DIAGNOSIS — N289 Disorder of kidney and ureter, unspecified: Secondary | ICD-10-CM | POA: Diagnosis not present

## 2020-01-22 DIAGNOSIS — Z9221 Personal history of antineoplastic chemotherapy: Secondary | ICD-10-CM | POA: Diagnosis not present

## 2020-01-22 DIAGNOSIS — Z8739 Personal history of other diseases of the musculoskeletal system and connective tissue: Secondary | ICD-10-CM | POA: Diagnosis not present

## 2020-02-23 DIAGNOSIS — Z961 Presence of intraocular lens: Secondary | ICD-10-CM | POA: Diagnosis not present

## 2020-02-23 DIAGNOSIS — H26493 Other secondary cataract, bilateral: Secondary | ICD-10-CM | POA: Diagnosis not present

## 2020-02-23 DIAGNOSIS — H52203 Unspecified astigmatism, bilateral: Secondary | ICD-10-CM | POA: Diagnosis not present

## 2020-03-16 DIAGNOSIS — H26491 Other secondary cataract, right eye: Secondary | ICD-10-CM | POA: Diagnosis not present

## 2020-05-10 DIAGNOSIS — N3281 Overactive bladder: Secondary | ICD-10-CM | POA: Diagnosis not present

## 2020-05-10 DIAGNOSIS — E039 Hypothyroidism, unspecified: Secondary | ICD-10-CM | POA: Diagnosis not present

## 2020-05-10 DIAGNOSIS — E782 Mixed hyperlipidemia: Secondary | ICD-10-CM | POA: Diagnosis not present

## 2020-05-10 DIAGNOSIS — Z1321 Encounter for screening for nutritional disorder: Secondary | ICD-10-CM | POA: Diagnosis not present

## 2020-05-11 DIAGNOSIS — E782 Mixed hyperlipidemia: Secondary | ICD-10-CM | POA: Diagnosis not present

## 2020-05-11 DIAGNOSIS — I499 Cardiac arrhythmia, unspecified: Secondary | ICD-10-CM | POA: Diagnosis not present

## 2020-05-11 DIAGNOSIS — Z136 Encounter for screening for cardiovascular disorders: Secondary | ICD-10-CM | POA: Diagnosis not present

## 2020-05-11 DIAGNOSIS — C8590 Non-Hodgkin lymphoma, unspecified, unspecified site: Secondary | ICD-10-CM | POA: Diagnosis not present

## 2020-05-25 DIAGNOSIS — N281 Cyst of kidney, acquired: Secondary | ICD-10-CM | POA: Diagnosis not present

## 2020-05-27 DIAGNOSIS — C8330 Diffuse large B-cell lymphoma, unspecified site: Secondary | ICD-10-CM | POA: Diagnosis not present

## 2020-06-16 DIAGNOSIS — E039 Hypothyroidism, unspecified: Secondary | ICD-10-CM | POA: Diagnosis not present

## 2020-06-16 DIAGNOSIS — K219 Gastro-esophageal reflux disease without esophagitis: Secondary | ICD-10-CM | POA: Diagnosis not present

## 2020-06-16 DIAGNOSIS — E559 Vitamin D deficiency, unspecified: Secondary | ICD-10-CM | POA: Diagnosis not present

## 2020-06-16 DIAGNOSIS — I499 Cardiac arrhythmia, unspecified: Secondary | ICD-10-CM | POA: Diagnosis not present

## 2020-06-16 DIAGNOSIS — C8590 Non-Hodgkin lymphoma, unspecified, unspecified site: Secondary | ICD-10-CM | POA: Diagnosis not present

## 2020-06-22 DIAGNOSIS — E042 Nontoxic multinodular goiter: Secondary | ICD-10-CM | POA: Diagnosis not present

## 2020-06-22 DIAGNOSIS — E039 Hypothyroidism, unspecified: Secondary | ICD-10-CM | POA: Diagnosis not present

## 2020-06-22 DIAGNOSIS — I499 Cardiac arrhythmia, unspecified: Secondary | ICD-10-CM | POA: Diagnosis not present

## 2020-07-13 ENCOUNTER — Encounter: Payer: Self-pay | Admitting: Cardiology

## 2020-07-13 NOTE — Progress Notes (Signed)
Cardiology Office Note  Date: 07/14/2020   ID: Victoria Garner, DOB Jan 12, 1956, MRN [id number]  PCP:  Celene Squibb, MD  Cardiologist:  Rozann Lesches, MD Electrophysiologist:  None   Chief Complaint  Patient presents with  . Irregular heartbeat    History of Present Illness: Victoria Garner is a 64 y.o. female for cardiology consultation by Dr. Nevada Crane with abnormal ECG.  She does not report any particular symptomatology such as palpitations, dizziness, or unusual breathlessness.  She has been aware of having been told she has an irregular heartbeat for many years.  I personally reviewed her ECG from 05/11/2020 which shows normal sinus rhythm with R' in lead V1 and V3, PAC noted, left anterior fascicular block.  There are no old tracings for comparison.  She does have a history of non-Hodgkin's lymphoma status post R-CHOP, currently in remission.  I reviewed her medications which are outlined below.  She is on Synthroid, recent TSH 4.86 in August.  I personally reviewed her ECG today which shows sinus rhythm with PACs, incomplete right bundle branch block and left anterior fascicular block.  Also increased voltage.  She works as a Event organiser.  Past Medical History:  Diagnosis Date  . GERD (gastroesophageal reflux disease)   . Hypothyroidism   . Non Hodgkin's lymphoma [facility])     Past Surgical History:  Procedure Laterality Date  . CATARACT EXTRACTION    . CESAREAN SECTION    . CHOLECYSTECTOMY    . OPEN REDUCTION INTERNAL FIXATION (ORIF) DISTAL RADIAL FRACTURE Left 12/28/2017   Procedure: OPEN REDUCTION INTERNAL FIXATION (ORIF) LEFT DISTAL RADIAL FRACTURE;  Surgeon: Leanora Cover, MD;  Location: [place];  Service: Orthopedics;  Laterality: Left;  . PORTACATH PLACEMENT    . TONSILLECTOMY      Current Outpatient Medications  Medication Sig Dispense Refill  . Cholecalciferol (VITAMIN D) 50 MCG (2000 UT) tablet Take  2,000 Units by mouth daily.    Marland Kitchen levothyroxine (SYNTHROID) 112 MCG tablet Take 112 mcg by mouth daily before breakfast.    . omeprazole (PRILOSEC) 40 MG capsule Take 40 mg by mouth daily.    . Vitamin D, Ergocalciferol, (DRISDOL) 1.25 MG (50000 UNIT) CAPS capsule Take 50,000 Units by mouth every 7 (seven) days.     No current facility-administered medications for this visit.   Allergies:  Penicillins   Social History: The patient  reports that she has quit smoking. Her smoking use included cigarettes. She has never used smokeless tobacco. She reports that she does not drink alcohol and does not use drugs.   Family History: The patient's family history includes COPD in her father; Cancer in her father and mother; Hypertension in her father.   ROS: No syncope.  Physical Exam: VS:  BP 104/74   Pulse 94   Ht 5\' 4"  (1.626 m)   Wt 230 lb (104.3 kg)   SpO2 94%   BMI 39.48 kg/m , BMI Body mass index is 39.48 kg/m.  Wt Readings from Last 3 Encounters:  07/14/20 230 lb (104.3 kg)  12/28/17 197 lb (89.4 kg)  12/25/17 197 lb (89.4 kg)    General: Patient appears comfortable at rest. HEENT: Conjunctiva and lids normal, wearing a mask. Neck: Supple, no elevated JVP or carotid bruits, no thyromegaly. Lungs: Clear to auscultation, nonlabored breathing at rest. Cardiac: Irregularly irregular, no S3 or significant systolic murmur, no pericardial rub. Abdomen: Soft, nontender, bowel sounds present. Extremities: No pitting edema, distal pulses  2+. Skin: Warm and dry. Musculoskeletal: No kyphosis. Neuropsychiatric: Alert and oriented x3, affect grossly appropriate.  ECG:  No old tracings for review today.  Recent Labwork:  August 2021: Hemoglobin 14.9, platelets 263, BUN 12, creatinine 0.78, potassium hemolyzed, ALT 22, cholesterol 191, triglycerides 87, HDL 63, LDL 112, TSH 4.86  Other Studies Reviewed Today:  No prior cardiac testing for review today.  Assessment and Plan:  1.  Irregular heartbeat, longstanding and associated with PACs documented by recent ECGs.  She is not aware of any sense of palpitations, and although having frequent PACs, it is not necessarily clear that this needs to be treated with AV nodal blockers unless she were to become symptomatic or have an associated cardiomyopathy.  We will obtain an echocardiogram to ensure structurally normal heart.  2.  Incomplete right bundle branch block and left anterior fascicular block.  No old tracings for review.  Not clear that this is of any major clinical significance at this point.  Medication Adjustments/Labs and Tests Ordered: Current medicines are reviewed at length with the patient today.  Concerns regarding medicines are outlined above.   Tests Ordered: Orders Placed This Encounter  Procedures  . Flu Vaccine QUAD 36+ mos IM  . EKG 12-Lead  . ECHOCARDIOGRAM COMPLETE    Medication Changes: No orders of the defined types were placed in this encounter.   Disposition:  Follow up test results.  Signed, Satira Sark, MD, [facility] 07/14/2020 9:30 AM    [facility] [place], [place], [place] [address] Phone: [phone]; Fax: [phone]

## 2020-07-14 ENCOUNTER — Encounter: Payer: Self-pay | Admitting: Cardiology

## 2020-07-14 ENCOUNTER — Ambulatory Visit: Payer: BLUE CROSS/BLUE SHIELD | Admitting: Cardiology

## 2020-07-14 VITALS — BP 104/74 | HR 94 | Ht 64.0 in | Wt 230.0 lb

## 2020-07-14 DIAGNOSIS — I491 Atrial premature depolarization: Secondary | ICD-10-CM

## 2020-07-14 DIAGNOSIS — R9431 Abnormal electrocardiogram [ECG] [EKG]: Secondary | ICD-10-CM

## 2020-07-14 DIAGNOSIS — Z23 Encounter for immunization: Secondary | ICD-10-CM | POA: Diagnosis not present

## 2020-07-14 NOTE — Patient Instructions (Addendum)

## 2020-07-21 ENCOUNTER — Ambulatory Visit (HOSPITAL_COMMUNITY)
Admission: RE | Admit: 2020-07-21 | Discharge: 2020-07-21 | Disposition: A | Discharge status: Home / Self Care | Payer: BLUE CROSS/BLUE SHIELD | Source: Ambulatory Visit | Attending: Cardiology | Admitting: Cardiology

## 2020-07-21 ENCOUNTER — Other Ambulatory Visit: Payer: Self-pay

## 2020-07-21 DIAGNOSIS — R9431 Abnormal electrocardiogram [ECG] [EKG]: Secondary | ICD-10-CM

## 2020-07-21 LAB — ECHOCARDIOGRAM COMPLETE
Area-P 1/2: 2.65 cm2
S' Lateral: 2.69 cm

## 2020-07-21 NOTE — Progress Notes (Signed)
*  PRELIMINARY RESULTS* Echocardiogram 2D Echocardiogram has been performed.  Victoria Garner 07/21/2020, 10:00 AM

## 2020-07-23 ENCOUNTER — Telehealth: Payer: Self-pay | Admitting: *Deleted

## 2020-07-23 NOTE — Telephone Encounter (Signed)
-----   Message from Satira Sark, MD sent at 07/21/2020 11:55 AM EDT ----- Results reviewed.  No evidence of cardiomyopathy, LVEF is normal at 60 to 65%, also normal RV contraction.  Would recommend observation with documented PACs and otherwise no significant arrhythmias.  Schedule follow-up in 1 year, keep interval visits with PCP.

## 2020-07-23 NOTE — Telephone Encounter (Signed)
Patient informed and verbalized understanding of plan. Copy sent to PCP 

## 2020-09-13 ENCOUNTER — Encounter (HOSPITAL_COMMUNITY): Payer: Self-pay | Admitting: Emergency Medicine

## 2020-09-13 ENCOUNTER — Emergency Department (HOSPITAL_COMMUNITY)
Admission: EM | Admit: 2020-09-13 | Discharge: 2020-09-13 | Disposition: A | Discharge status: Home / Self Care | Payer: BLUE CROSS/BLUE SHIELD | Attending: Emergency Medicine | Admitting: Emergency Medicine

## 2020-09-13 ENCOUNTER — Other Ambulatory Visit: Payer: Self-pay

## 2020-09-13 ENCOUNTER — Emergency Department (HOSPITAL_COMMUNITY): Payer: BLUE CROSS/BLUE SHIELD

## 2020-09-13 DIAGNOSIS — Z87891 Personal history of nicotine dependence: Secondary | ICD-10-CM | POA: Insufficient documentation

## 2020-09-13 DIAGNOSIS — E039 Hypothyroidism, unspecified: Secondary | ICD-10-CM | POA: Diagnosis not present

## 2020-09-13 DIAGNOSIS — R0789 Other chest pain: Secondary | ICD-10-CM | POA: Diagnosis not present

## 2020-09-13 DIAGNOSIS — Z79899 Other long term (current) drug therapy: Secondary | ICD-10-CM | POA: Diagnosis not present

## 2020-09-13 DIAGNOSIS — Z959 Presence of cardiac and vascular implant and graft, unspecified: Secondary | ICD-10-CM | POA: Diagnosis not present

## 2020-09-13 DIAGNOSIS — R0689 Other abnormalities of breathing: Secondary | ICD-10-CM | POA: Diagnosis not present

## 2020-09-13 DIAGNOSIS — K449 Diaphragmatic hernia without obstruction or gangrene: Secondary | ICD-10-CM | POA: Diagnosis not present

## 2020-09-13 DIAGNOSIS — R079 Chest pain, unspecified: Secondary | ICD-10-CM | POA: Diagnosis not present
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
## 2020-11-16 DIAGNOSIS — Z9221 Personal history of antineoplastic chemotherapy: Secondary | ICD-10-CM | POA: Diagnosis not present

## 2020-11-16 DIAGNOSIS — C8330 Diffuse large B-cell lymphoma, unspecified site: Secondary | ICD-10-CM | POA: Diagnosis not present

## 2020-11-16 DIAGNOSIS — Z880 Allergy status to penicillin: Secondary | ICD-10-CM | POA: Diagnosis not present

## 2020-11-16 DIAGNOSIS — Z08 Encounter for follow-up examination after completed treatment for malignant neoplasm: Secondary | ICD-10-CM | POA: Diagnosis not present

## 2020-11-16 DIAGNOSIS — Z87891 Personal history of nicotine dependence: Secondary | ICD-10-CM | POA: Diagnosis not present

## 2020-11-16 DIAGNOSIS — Z8572 Personal history of non-Hodgkin lymphomas: Secondary | ICD-10-CM | POA: Diagnosis not present

## 2020-12-13 DIAGNOSIS — Z13220 Encounter for screening for lipoid disorders: Secondary | ICD-10-CM | POA: Diagnosis not present

## 2020-12-13 DIAGNOSIS — E039 Hypothyroidism, unspecified: Secondary | ICD-10-CM | POA: Diagnosis not present

## 2020-12-13 DIAGNOSIS — E669 Obesity, unspecified: Secondary | ICD-10-CM | POA: Diagnosis not present

## 2020-12-13 DIAGNOSIS — Z1231 Encounter for screening mammogram for malignant neoplasm of breast: Secondary | ICD-10-CM | POA: Diagnosis not present

## 2020-12-13 DIAGNOSIS — E559 Vitamin D deficiency, unspecified: Secondary | ICD-10-CM | POA: Diagnosis not present

## 2020-12-13 DIAGNOSIS — C8338 Diffuse large B-cell lymphoma, lymph nodes of multiple sites: Secondary | ICD-10-CM | POA: Diagnosis not present

## 2021-05-24 DIAGNOSIS — C8338 Diffuse large B-cell lymphoma, lymph nodes of multiple sites: Secondary | ICD-10-CM | POA: Diagnosis not present

## 2021-05-24 DIAGNOSIS — C8330 Diffuse large B-cell lymphoma, unspecified site: Secondary | ICD-10-CM | POA: Diagnosis not present

## 2021-06-15 ENCOUNTER — Telehealth: Payer: Self-pay

## 2021-06-15 ENCOUNTER — Other Ambulatory Visit: Payer: Self-pay | Admitting: Adult Health Nurse Practitioner

## 2021-06-15 ENCOUNTER — Ambulatory Visit: Payer: BLUE CROSS/BLUE SHIELD

## 2021-06-15 DIAGNOSIS — E039 Hypothyroidism, unspecified: Secondary | ICD-10-CM | POA: Diagnosis not present

## 2021-06-15 DIAGNOSIS — E782 Mixed hyperlipidemia: Secondary | ICD-10-CM | POA: Diagnosis not present

## 2021-06-15 DIAGNOSIS — I499 Cardiac arrhythmia, unspecified: Secondary | ICD-10-CM

## 2021-06-15 DIAGNOSIS — E559 Vitamin D deficiency, unspecified: Secondary | ICD-10-CM | POA: Diagnosis not present

## 2021-06-15 DIAGNOSIS — Z0000 Encounter for general adult medical examination without abnormal findings: Secondary | ICD-10-CM | POA: Diagnosis not present

## 2021-06-15 DIAGNOSIS — Z23 Encounter for immunization: Secondary | ICD-10-CM | POA: Diagnosis not present

## 2021-06-15 NOTE — Telephone Encounter (Signed)
Received order from Victoria Garner, Victoria Lawrence, NP- for 3-7 day Holter Monitor. Will ship to pt's home.  Pt is currently a pt of Dr. Domenic Polite.

## 2021-09-14 DIAGNOSIS — H26492 Other secondary cataract, left eye: Secondary | ICD-10-CM | POA: Diagnosis not present

## 2021-09-14 DIAGNOSIS — H40013 Open angle with borderline findings, low risk, bilateral: Secondary | ICD-10-CM | POA: Diagnosis not present

## 2021-09-14 DIAGNOSIS — Z961 Presence of intraocular lens: Secondary | ICD-10-CM | POA: Diagnosis not present

## 2021-09-14 DIAGNOSIS — H5202 Hypermetropia, left eye: Secondary | ICD-10-CM | POA: Diagnosis not present

## 2022-02-21 ENCOUNTER — Other Ambulatory Visit (HOSPITAL_COMMUNITY): Payer: Self-pay | Admitting: Adult Health Nurse Practitioner

## 2022-02-21 DIAGNOSIS — Z1231 Encounter for screening mammogram for malignant neoplasm of breast: Secondary | ICD-10-CM

## 2022-04-03 ENCOUNTER — Ambulatory Visit (HOSPITAL_COMMUNITY)
Admission: RE | Admit: 2022-04-03 | Discharge: 2022-04-03 | Disposition: A | Discharge status: Home / Self Care | Payer: BLUE CROSS/BLUE SHIELD | Source: Ambulatory Visit | Attending: Adult Health Nurse Practitioner | Admitting: Adult Health Nurse Practitioner

## 2022-04-03 DIAGNOSIS — Z1231 Encounter for screening mammogram for malignant neoplasm of breast: Secondary | ICD-10-CM | POA: Diagnosis present

## 2022-12-18 ENCOUNTER — Encounter: Payer: Self-pay | Admitting: Cardiology
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
## 2022-12-20 ENCOUNTER — Encounter: Payer: Self-pay | Admitting: Adult Health Nurse Practitioner

## 2022-12-20 ENCOUNTER — Encounter: Payer: Self-pay | Admitting: Cardiology

## 2022-12-20 ENCOUNTER — Ambulatory Visit: Payer: BLUE CROSS/BLUE SHIELD | Attending: Cardiology

## 2022-12-20 ENCOUNTER — Ambulatory Visit: Payer: BLUE CROSS/BLUE SHIELD | Attending: Cardiology | Admitting: Cardiology

## 2022-12-20 VITALS — BP 118/82 | HR 97 | Ht 64.0 in | Wt 230.6 lb

## 2022-12-20 DIAGNOSIS — I499 Cardiac arrhythmia, unspecified: Secondary | ICD-10-CM | POA: Diagnosis not present

## 2022-12-20 DIAGNOSIS — I491 Atrial premature depolarization: Secondary | ICD-10-CM | POA: Diagnosis not present

## 2022-12-20 DIAGNOSIS — E039 Hypothyroidism, unspecified: Secondary | ICD-10-CM | POA: Diagnosis not present

## 2022-12-20 NOTE — Patient Instructions (Signed)
Medication Instructions:  Your physician recommends that you continue on your current medications as directed. Please refer to the Current Medication list given to you today.   Labwork: None  Testing/Procedures: Zio Monitor  Follow-Up: Follow up is pending testing results.   Any Other Special Instructions Will Be Listed Below (If Applicable).     If you need a refill on your cardiac medications before your next appointment, please call your pharmacy.   ZIO XT- Long Term Monitor Instructions   Your physician has requested you wear your ZIO patch monitor_______days.   This is a single patch monitor.  [employer] supplies one patch monitor per enrollment.  Additional stickers are not available.   Please do not apply patch if you will be having a Nuclear Stress Test, Echocardiogram, Cardiac CT, MRI, or Chest Xray during the time frame you would be wearing the monitor. The patch cannot be worn during these tests.  You cannot remove and re-apply the ZIO XT patch monitor.   Your ZIO patch monitor will be sent USPS Priority mail from [facility] directly to your home address. The monitor may also be mailed to a PO BOX if home delivery is not available.   It may take 3-5 days to receive your monitor after you have been enrolled.   Once you have received you monitor, please review enclosed instructions.  Your monitor has already been registered assigning a specific monitor serial # to you.   Applying the monitor   Shave hair from upper left chest.   Hold abrader disc by orange tab.  Rub abrader in 40 strokes over left upper chest as indicated in your monitor instructions.   Clean area with 4 enclosed alcohol pads .  Use all pads to assure are is cleaned thoroughly.  Let dry.   Apply patch as indicated in monitor instructions.  Patch will be place under collarbone on left side of chest with arrow pointing upward.   Rub patch adhesive wings for 2 minutes.Remove white label marked  "1".  Remove white label marked "2".  Rub patch adhesive wings for 2 additional minutes.   While looking in a mirror, press and release button in center of patch.  A small green light will flash 3-4 times .  This will be your only indicator the monitor has been turned on.     Do not shower for the first 24 hours.  You may shower after the first 24 hours.   Press button if you feel a symptom. You will hear a small click.  Record Date, Time and Symptom in the Patient Log Book.   When you are ready to remove patch, follow instructions on last 2 pages of Patient Log Book.  Stick patch monitor onto last page of Patient Log Book.   Place Patient Log Book in [place] box.  Use locking tab on box and tape box closed securely.  The [employer] and [employer] has [employer] on it.  Please place in mailbox as soon as possible.  Your physician should have your test results approximately 7 days after the monitor has been mailed back to [facility].   Call [facility] at [phone] if you have questions regarding your ZIO XT patch monitor.  Call them immediately if you see an orange light blinking on your monitor.   If your monitor falls off in less than 4 days contact our Monitor department at [phone].  If your monitor becomes loose or falls off after 4 days  call [employer] at [phone] for suggestions on securing your monitor.

## 2023-01-23 ENCOUNTER — Telehealth: Payer: Self-pay

## 2023-01-23 MED ORDER — METOPROLOL SUCCINATE ER 25 MG PO TB24: 25.0000 mg | ORAL_TABLET | Freq: Every day | ORAL | 3 refills | Status: DC

## 2023-01-23 NOTE — Telephone Encounter (Signed)
-----   Message from Jonelle Sidle, MD sent at 01/23/2023  4:31 PM EDT ----- Results reviewed.  Cardiac monitor did not show any atrial fibrillation which was our main concern.  She does have frequent PACs and bursts of PSVT which would certainly correlate to an irregular heartbeat at times.  Let's start Toprol-XL 25 mg daily for now.  Arrange office follow-up in the next 3 to 4 months.

## 2023-01-23 NOTE — Telephone Encounter (Signed)
Patient notified and verbalized understanding. Patient had no questions or concerns at this time. PCP copied 

## 2023-03-12 ENCOUNTER — Other Ambulatory Visit: Payer: Self-pay

## 2023-03-12 MED ORDER — METOPROLOL SUCCINATE ER 25 MG PO TB24: 25.0000 mg | ORAL_TABLET | Freq: Every day | ORAL | 3 refills | Status: DC

## 2023-05-17 ENCOUNTER — Ambulatory Visit: Payer: BLUE CROSS/BLUE SHIELD | Admitting: Cardiology

## 2023-05-17 NOTE — Progress Notes (Deleted)
Cardiology Office Note  Date: 05/17/2023   ID: Victoria Garner, DOB Jul 20, 1956, MRN [id number]  History of Present Illness: Victoria Garner is a 67 y.o. female last seen in April.  Physical Exam: VS:  There were no vitals taken for this visit., BMI There is no height or weight on file to calculate BMI.  Wt Readings from Last 3 Encounters:  12/20/22 230 lb 9.6 oz (104.6 kg)  09/13/20 230 lb (104.3 kg)  07/14/20 230 lb (104.3 kg)    General: Patient appears comfortable at rest. HEENT: Conjunctiva and lids normal, oropharynx clear with moist mucosa. Neck: Supple, no elevated JVP or carotid bruits, no thyromegaly. Lungs: Clear to auscultation, nonlabored breathing at rest. Cardiac: Regular rate and rhythm, no S3 or significant systolic murmur, no pericardial rub. Abdomen: Soft, nontender, no hepatomegaly, bowel sounds present, no guarding or rebound. Extremities: No pitting edema, distal pulses 2+. Skin: Warm and dry. Musculoskeletal: No kyphosis. Neuropsychiatric: Alert and oriented x3, affect grossly appropriate.  ECG:  An ECG dated 12/20/2022 was personally reviewed today and demonstrated:  Sinus rhythm with frequent PACs, right bundle branch block, left anterior fascicular block.  Labwork:  September 2023: Cholesterol 186, triglycerides 62, HDL 71, LDL 103 February 2024: Hemoglobin 15.2, platelets 361, potassium 3.9, BUN 18, creatinine 0.9, AST 18, ALT 13  Other Studies Reviewed Today:  Cardiac monitor May 2024: ZIO monitor reviewed.  6 days, 11 hours analyzed.   Predominant rhythm is sinus with heart rate ranging from 50 bpm up to 152 bpm and average heart rate 83 bpm. There were frequent PACs representing 23.2% total beats with otherwise rare atrial couplets and triplets. Multiple episodes of PSVT were noted, the longest of which lasted 22 seconds. There were rare PVCs including ventricular couplets representing less than 1% total beats. No pauses or high degree  heart block.  Assessment and Plan:  Frequent PACs and brief episodes of PSVT.  Currently on Toprol-XL.  No atrial fibrillation documented by cardiac monitor in May.  Disposition:  Follow up {follow up:15908}  Signed, Jonelle Sidle, M.D., F.A.C.C. [facility] at [facility]

## 2023-08-09 ENCOUNTER — Ambulatory Visit: Payer: Self-pay | Attending: Cardiology | Admitting: Cardiology

## 2023-08-09 ENCOUNTER — Encounter: Payer: Self-pay | Admitting: Cardiology

## 2023-08-09 VITALS — BP 124/86 | HR 77 | Ht 64.0 in | Wt 231.2 lb

## 2023-08-09 DIAGNOSIS — I491 Atrial premature depolarization: Secondary | ICD-10-CM

## 2023-08-09 DIAGNOSIS — I471 Supraventricular tachycardia, unspecified: Secondary | ICD-10-CM

## 2023-08-09 NOTE — Progress Notes (Signed)
    Cardiology Office Note  Date: 08/09/2023   ID: Victoria Garner, DOB Apr 27, 1956, MRN [id number]  History of Present Illness: Victoria Garner is a 67 y.o. female last seen in April.  She is here for a follow-up visit.  Reports no definite sense of palpitations, no unusual shortness of breath or new exertional symptoms.  No dizziness or syncope.  I reviewed her medications, she continues on Toprol-XL 25 mg daily.  We discussed the results of her cardiac monitor showing frequent PACs and also bursts of PSVT.  She is tolerating beta-blocker so far.  Physical Exam: VS:  BP 124/86   Pulse 77   Ht 5\' 4"  (1.626 m)   Wt 231 lb 3.2 oz (104.9 kg)   SpO2 97%   BMI 39.69 kg/m , BMI Body mass index is 39.69 kg/m.  Wt Readings from Last 3 Encounters:  08/09/23 231 lb 3.2 oz (104.9 kg)  12/20/22 230 lb 9.6 oz (104.6 kg)  09/13/20 230 lb (104.3 kg)    General: Patient appears comfortable at rest. HEENT: Conjunctiva and lids normal. Lungs: Clear to auscultation, nonlabored breathing at rest. Cardiac: Regular rate and rhythm, no S3 or significant systolic murmur, no pericardial rub.  ECG:  An ECG dated 12/20/2022 was personally reviewed today and demonstrated:  Sinus rhythm with frequent PACs, right bundle branch block and left anterior fascicular block.  Labwork:  February 2024: Hemoglobin 15.2, platelets 361, potassium 3.9, BUN 18, creatinine 0.26 December 2022: TSH 0.811  Other Studies Reviewed Today:  Cardiac monitor May 2024: ZIO monitor reviewed.  6 days, 11 hours analyzed.   Predominant rhythm is sinus with heart rate ranging from 50 bpm up to 152 bpm and average heart rate 83 bpm. There were frequent PACs representing 23.2% total beats with otherwise rare atrial couplets and triplets. Multiple episodes of PSVT were noted, the longest of which lasted 22 seconds. There were rare PVCs including ventricular couplets representing less than 1% total beats. No pauses or high degree  heart block.  Assessment and Plan:  1.  Frequent premature atrial contractions and intermittent episodes of PSVT by cardiac monitor in May of this year.  No atrial fibrillation documented.  Echocardiogram from 2021 revealed normal LVEF at 60 to 65%, left atrium mildly dilated, no major valvular abnormalities.  She is tolerating Toprol-XL 25 mg daily and heart rate is regular today.  Will continue with observation for now.   2.  Hypothyroidism on Synthroid.  TSH normal in April.  Keep follow-up with PCP.  She is on Synthroid.  Disposition:  Follow up  1 year.  Signed, Jonelle Sidle, M.D., F.A.C.C. [facility] at [facility]

## 2023-08-09 NOTE — Patient Instructions (Signed)
Medication Instructions:  Your physician recommends that you continue on your current medications as directed. Please refer to the Current Medication list given to you today.   Labwork: None today  Testing/Procedures: None today  Follow-Up: 1 year  Any Other Special Instructions Will Be Listed Below (If Applicable).  If you need a refill on your cardiac medications before your next appointment, please call your pharmacy.  

## 2024-02-20 ENCOUNTER — Other Ambulatory Visit: Payer: Self-pay | Admitting: Cardiology

## 2024-07-02 ENCOUNTER — Other Ambulatory Visit: Payer: Self-pay | Admitting: Cardiology

## 2024-12-23 ENCOUNTER — Ambulatory Visit: Payer: Self-pay | Admitting: Cardiology
